# Patient Record
Sex: Female | Born: 1980 | Race: White | Hispanic: No | Marital: Married | Smoking: Former smoker
Health system: Southern US, Community
[De-identification: ages and names within clinical notes are randomized; demographics above are authoritative.]

## PROBLEM LIST (undated history)

## (undated) DIAGNOSIS — D649 Anemia, unspecified: Secondary | ICD-10-CM

## (undated) DIAGNOSIS — M6530 Trigger finger, unspecified finger: Secondary | ICD-10-CM

## (undated) DIAGNOSIS — E785 Hyperlipidemia, unspecified: Secondary | ICD-10-CM

## (undated) DIAGNOSIS — G43909 Migraine, unspecified, not intractable, without status migrainosus: Secondary | ICD-10-CM

## (undated) HISTORY — DX: Hyperlipidemia, unspecified: E78.5

## (undated) HISTORY — PX: WISDOM TOOTH EXTRACTION: SHX21

## (undated) HISTORY — DX: Migraine, unspecified, not intractable, without status migrainosus: G43.909

---
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
## 1998-06-20 ENCOUNTER — Other Ambulatory Visit: Admission: RE | Admit: 1998-06-20 | Discharge: 1998-06-20 | Payer: Self-pay | Admitting: Family Medicine

## 1998-08-13 ENCOUNTER — Ambulatory Visit (HOSPITAL_COMMUNITY): Admission: RE | Admit: 1998-08-13 | Discharge: 1998-08-13 | Payer: Self-pay

## 1998-08-13 ENCOUNTER — Encounter: Payer: Self-pay | Admitting: Family Medicine

## 1999-01-09 ENCOUNTER — Encounter (HOSPITAL_COMMUNITY): Admission: RE | Admit: 1999-01-09 | Discharge: 1999-01-14 | Payer: Self-pay | Admitting: Family Medicine

## 1999-01-13 ENCOUNTER — Inpatient Hospital Stay (HOSPITAL_COMMUNITY): Admission: AD | Admit: 1999-01-13 | Discharge: 1999-01-19 | Payer: Self-pay | Admitting: Family Medicine

## 1999-08-11 ENCOUNTER — Other Ambulatory Visit: Admission: RE | Admit: 1999-08-11 | Discharge: 1999-08-11 | Payer: Self-pay | Admitting: Family Medicine

## 2000-08-11 ENCOUNTER — Other Ambulatory Visit: Admission: RE | Admit: 2000-08-11 | Discharge: 2000-08-11 | Payer: Self-pay | Admitting: Family Medicine

## 2001-10-10 ENCOUNTER — Other Ambulatory Visit: Admission: RE | Admit: 2001-10-10 | Discharge: 2001-10-10 | Payer: Self-pay | Admitting: Family Medicine

## 2003-08-16 ENCOUNTER — Other Ambulatory Visit: Admission: RE | Admit: 2003-08-16 | Discharge: 2003-08-16 | Payer: Self-pay | Admitting: Family Medicine

## 2004-08-31 ENCOUNTER — Other Ambulatory Visit: Admission: RE | Admit: 2004-08-31 | Discharge: 2004-08-31 | Payer: Self-pay | Admitting: Obstetrics and Gynecology

## 2004-12-21 ENCOUNTER — Inpatient Hospital Stay (HOSPITAL_COMMUNITY): Admission: AD | Admit: 2004-12-21 | Discharge: 2004-12-21 | Payer: Self-pay | Admitting: Obstetrics and Gynecology

## 2005-01-14 ENCOUNTER — Ambulatory Visit (HOSPITAL_COMMUNITY): Admission: RE | Admit: 2005-01-14 | Discharge: 2005-01-14 | Payer: Self-pay | Admitting: Obstetrics and Gynecology

## 2005-03-02 ENCOUNTER — Inpatient Hospital Stay (HOSPITAL_COMMUNITY): Admission: RE | Admit: 2005-03-02 | Discharge: 2005-03-04 | Payer: Self-pay | Admitting: Obstetrics and Gynecology

## 2006-06-28 IMAGING — US US ABDOMEN COMPLETE
1 series · 18 of 25 positions shown · non-contrast
Comparison: none

CLINICAL DATA: 31-weeks estimated gestational age with right upper quadrant pain.  Evaluate gallbladder.  
ABDOMEN ULTRASOUND:
TECHNIQUE: Complete abdominal ultrasound examination was performed including evaluation of the liver, gallbladder, bile ducts, pancreas, kidneys, spleen, IVC, and abdominal aorta.

[Series 1: us abdomen complete · 18 of 81 slices shown]
[im 1/81]
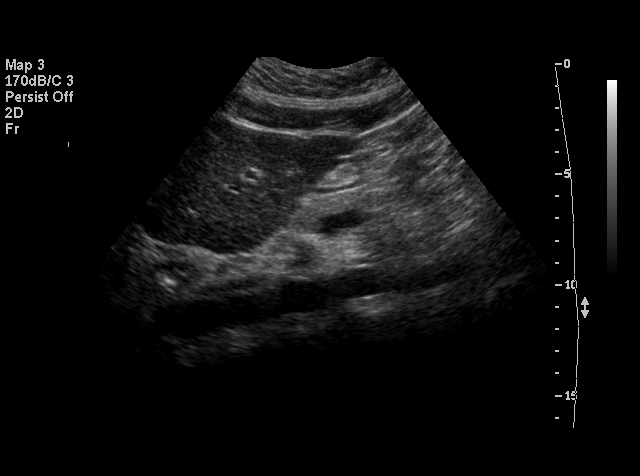
[im 7/81]
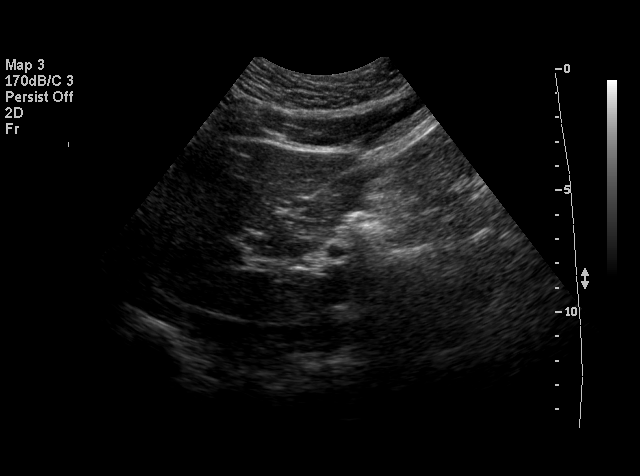
[im 11/81]
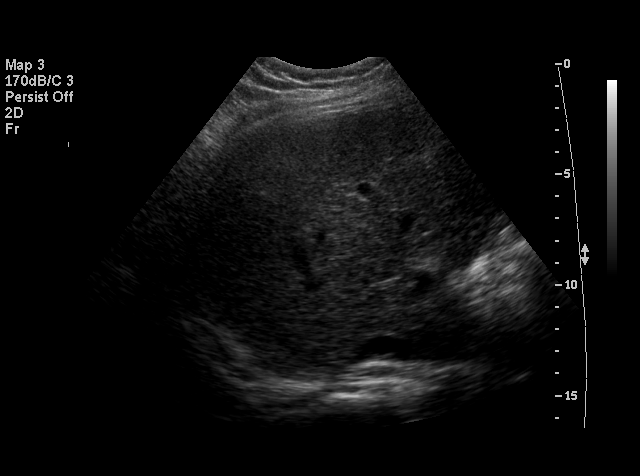
[im 14/81]
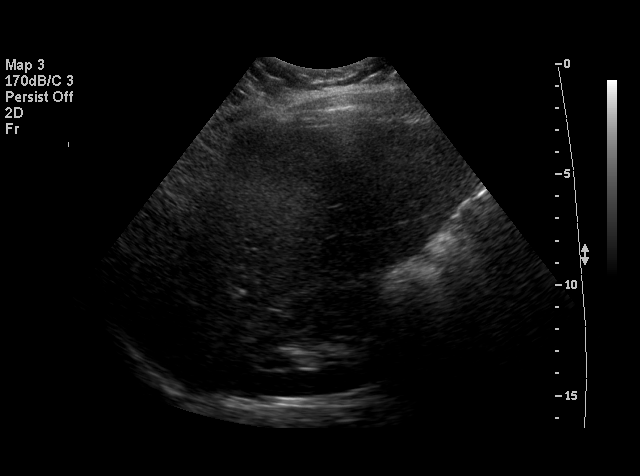
[im 21/81]
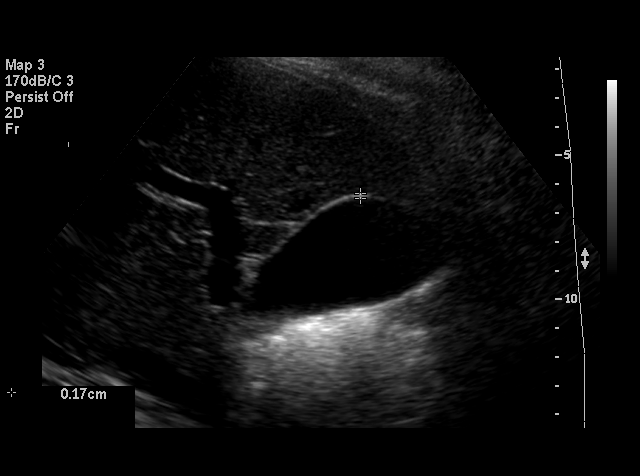
[im 24/81]
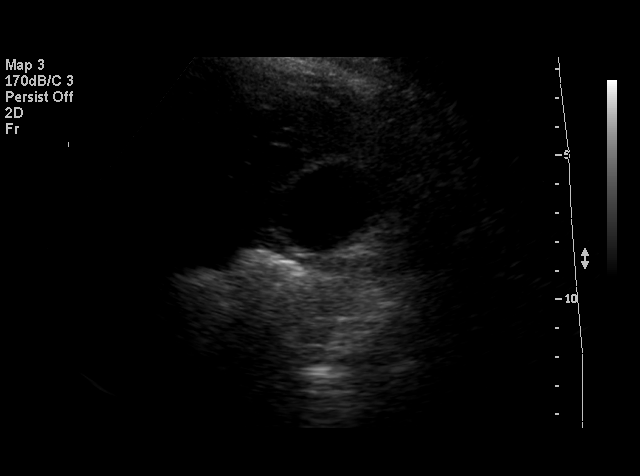
[im 31/81]
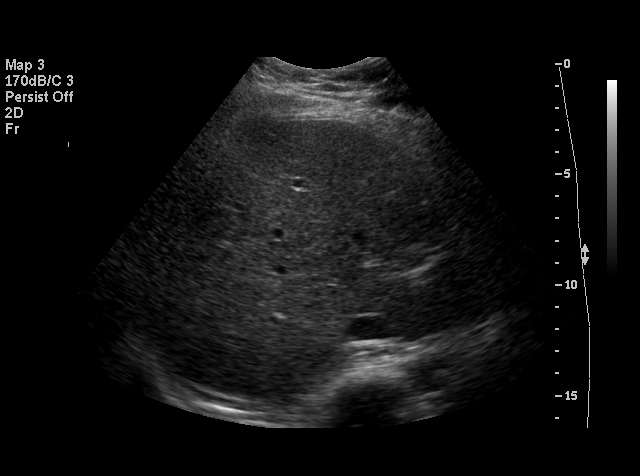
[im 34/81]
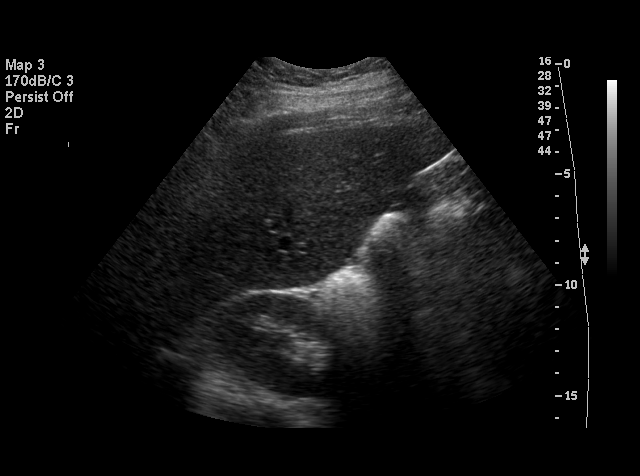
[im 37/81]
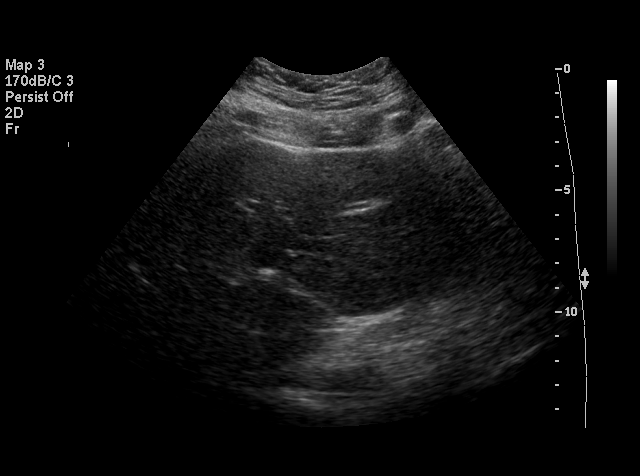
[im 44/81]
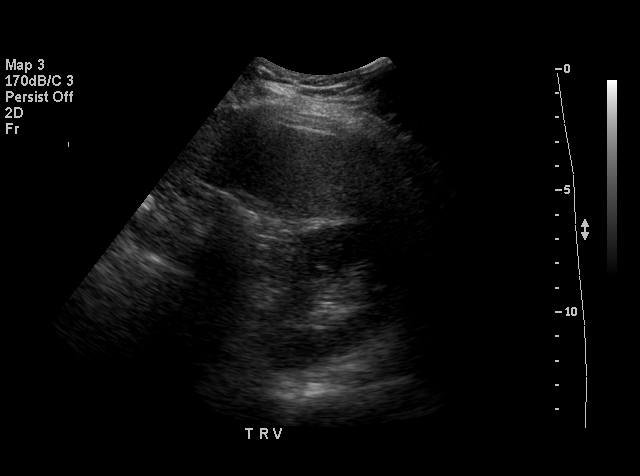
[im 47/81]
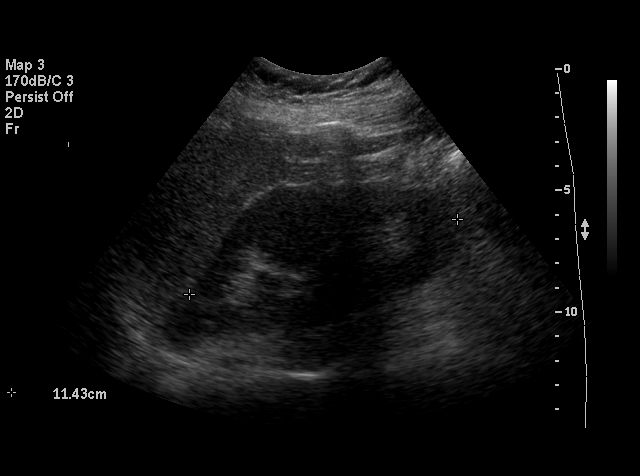
[im 51/81]
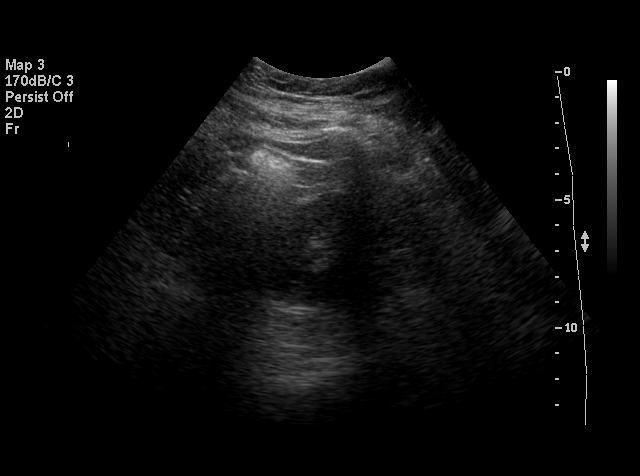
[im 57/81]
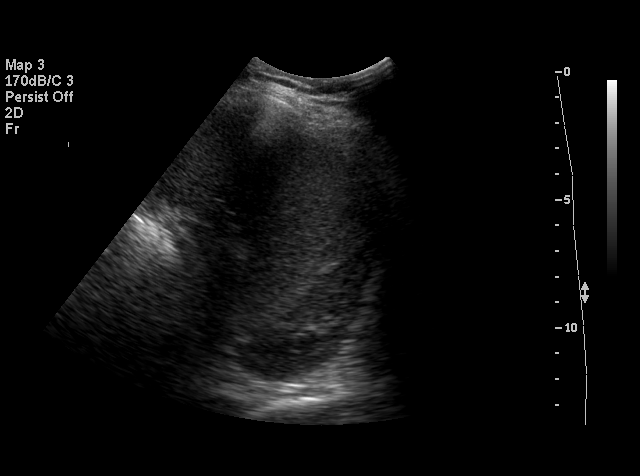
[im 61/81]
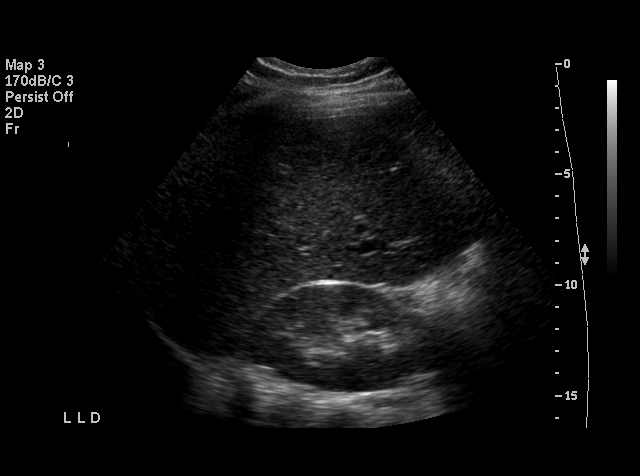
[im 67/81]
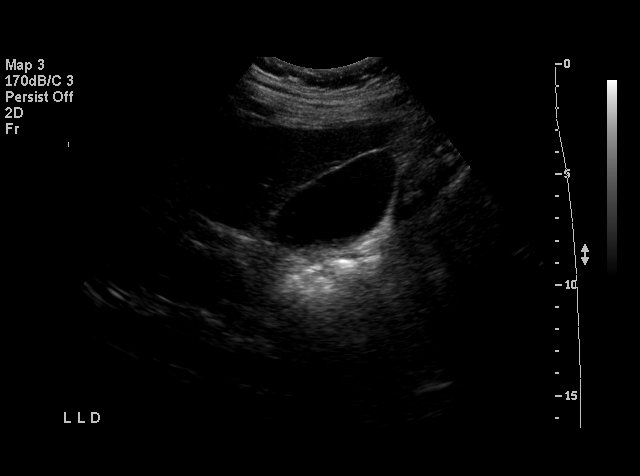
[im 71/81]
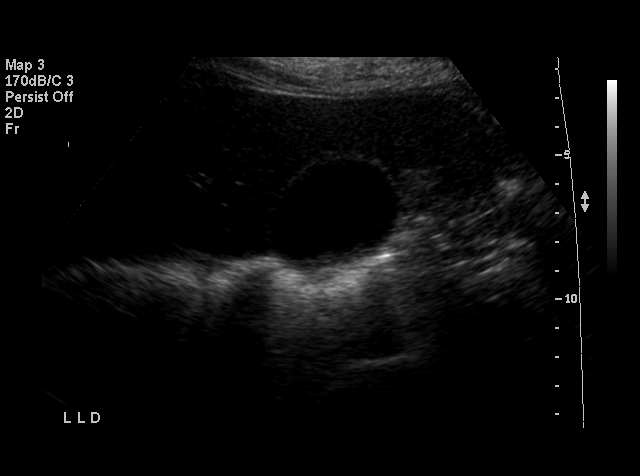
[im 74/81]
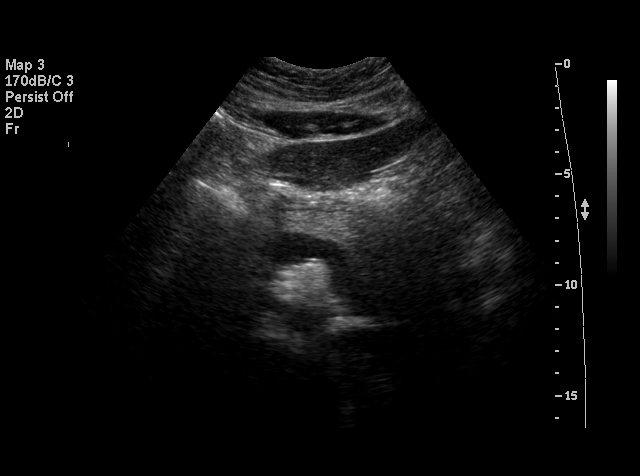
[im 81/81]
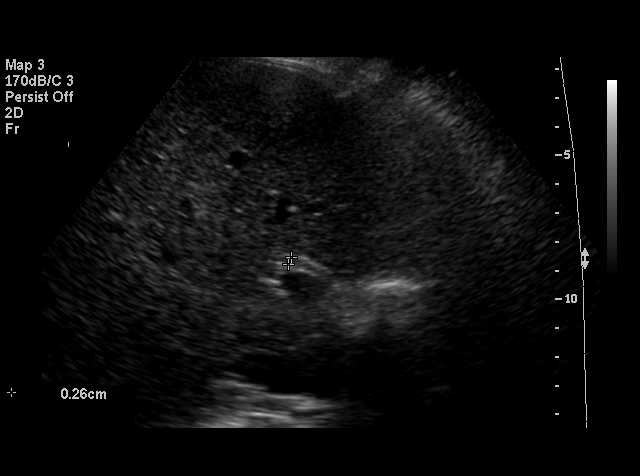

[18 of 25 positions shown; findings below may reference images not displayed]

FINDINGS: The liver parenchyma appears homogeneous in echotexture without evidence of focal parenchymal abnormality or intrahepatic ductal dilatation.  The gallbladder is well distended and shows no evidence of intraluminal stones or sludge.  No pericholecystic fluid or gallbladder wall thickening is seen.  The common bile duct measures .22 cm in AP width and this is within normal limits for size.  
The pancreas is seen in its entirety and is normal in size and echogenicity.  Both kidneys have a normal appearance with the left kidney having a sagittal length of 11.5 cm and the right kidney having a sagittal length of 11.9 cm.  No focal parenchymal abnormalities or signs of hydronephrosis are noted.   The spleen has a normal ultrasound appearance.  No intraabdominal fluid is seen.  
The proximal portions of the inferior vena cava and abdominal aorta are within normal limits.  The distal aspect of the aorta was obscured by the patient?s gravid uterus.
IMPRESSION: Normal abdominal ultrasound.

## 2015-12-22 ENCOUNTER — Encounter: Payer: Self-pay | Admitting: Physician Assistant

## 2015-12-22 ENCOUNTER — Ambulatory Visit (INDEPENDENT_AMBULATORY_CARE_PROVIDER_SITE_OTHER): Payer: BLUE CROSS/BLUE SHIELD | Admitting: Physician Assistant

## 2015-12-22 VITALS — BP 116/77 | HR 75 | Temp 97.1°F | Ht 65.0 in | Wt 245.2 lb

## 2015-12-22 DIAGNOSIS — Z0000 Encounter for general adult medical examination without abnormal findings: Secondary | ICD-10-CM

## 2015-12-22 MED ORDER — AMOXICILLIN 250 MG/5ML PO SUSR: 250.0000 mg | Freq: Three times a day (TID) | ORAL | 0 refills | Status: DC

## 2015-12-22 NOTE — Patient Instructions (Addendum)
Lipid Profile Test Why am I having this test? The lipid profile test gives results that can help predict the likelihood of developing heart disease. The test is also used to monitor treatment for high cholesterol to see if you are reaching your goals. A lipid profile measures the following:  Total cholesterol. Cholesterol is a waxy fat in your blood. If your total cholesterol is elevated, this can increase your risk of coronary heart disease.  High-density lipoprotein (HDL). This is known as the good cholesterol. Having a high level of HDL is good. Your HDL level may be low if you smoke or do not get enough exercise.  Low-density lipoprotein (LDL). This is known as the bad cholesterol and is responsible for the formation of plaque in the arteries. Having a low level of LDL is best.  Cholesterol to HDL ratio. This is calculated by dividing the total cholesterol by the HDL cholesterol. The ratio is used by health care providers for determining your risk of heart disease. A low ratio is best.  Triglycerides. These are a type of fat in the blood responsible for providing energy to your cells. Low levels are best. What kind of sample is taken? A blood sample is required for this test. It is usually collected by inserting a needle into a vein. How do I prepare for this test? Do not eat or drink anything after midnight on the night before the test or as directed by your health care provider. What are the reference ranges? Reference ranges are considered healthy ranges established after testing a large group of healthy people. Reference ranges may vary among different people, labs, and hospitals. It is your responsibility to obtain your test results. Ask the lab or department performing the test when and how you will get your results. Reference ranges for the lipid profile test are as follows: Total Cholesterol  Adult or elderly: less than 200 mg/dL or less than 7.825.20 mmol/L (SI units).  Child:  120-200 mg/dL.  Infant: 70-175 mg/dL.  Newborns: 53-135 mg/dL. HDL  Female: greater than 45 mg/dL or greater than 9.560.75 mmol/L (SI units).  Female: greater than 55 mg/dL or greater than 2.130.91 mmol/L (SI units). HDL reference values based on risk of heart disease:  For low risk of heart disease:  Female: 60 mg/dL or 0.861.55 mmol/L.  Female: 70 mg/dL or 5.781.81 mmol/L.  For moderate risk of heart disease:  Female: 45 mg/dL or 4.691.17 mmol/L.  Female: 55 mg/dL or 6.291.42 mmol/L.  For high risk of heart disease:  Female: 25 mg/dL or 5.280.65 mmol/L.  Female: 35 mg/dL or 4.130.90 mmol/L. LDL  Adult: less than 130 mg/dL.  Children: less than 110 mg/dL. Cholesterol to HDL Ratio  Reference values based on risk for coronary heart disease:  Risk that is one half average:  Female: 3.4.  Female: 3.3.  Average risk:  Female: 5.0.  Female: 4.4.  Risk that is two times average (moderate risk):  Female: 10.0.  Female: 7.0.  Risk that is three times average (high risk):    Female: 11.0. Triglycerides  Adult or elderly:  Female: 40-160 mg/dL or 2.44-0.100.45-1.81 mmol/L (SI units).  Female: 35-135 mg/dL or 2.72-5.360.40-1.52 mmol/L (SI units).  Children 410-91 years old:  Female: 30-86 mg/dL.  Female: 32-99 mg/dL.  Children 376-677 years old:  Female: 31-108 mg/dL.  Female: 35-114 mg/dL.  Children 2012-37 years old:  Female: 36-138 mg/dL.  Female: 41-138 mg/dL.  Children 6516-37 years old:  Female: 40-163 mg/dL.  Female: 40-128 mg/dL.  Triglycerides should be less than 400 mg/dL even when you are not fasting. What do the results mean? Talk with your health care provider to discuss your results, treatment options, and if necessary, the need for more tests. Talk with your health care provider if you have any questions about your results. Talk with your health care provider to discuss your results, treatment options, and if necessary, the need for more tests. Talk with your health care provider if you have any  questions about your results. This information is not intended to replace advice given to you by your health care provider. Make sure you discuss any questions you have with your health care provider. Document Released: 01/22/2004 Document Revised: 09/03/2015 Document Reviewed: 04/19/2013 Elsevier Interactive Patient Education  2017 [employer]

## 2015-12-23 NOTE — Progress Notes (Signed)
BP 116/77   Pulse 75   Temp 97.1 F (36.2 C) (Oral)   Ht 5\' 5"  (1.651 m)   Wt 245 lb 3.2 oz (111.2 kg)   LMP 12/22/2015   BMI 40.80 kg/m    Subjective:    Patient ID: Chelsea Snyder, female    DOB: February 08, 1980, 35 y.o.   MRN: [id number]  Chelsea Snyder is a 35 y.o. female presenting on 12/22/2015 for Annual Exam  HPI Patient here to be [facility].  This patient [facility] to [facility]. This patient comes in for annual well exam. All medications are reviewed today. There are no reports of any problems with the medications. All of the medical conditions are reviewed and updated.  Lab work is reviewed and will be ordered as medically necessary. There are no new problems reported with today's visit.   Past Medical History:  Diagnosis Date  . Hyperlipidemia   . Migraine    Relevant past medical, surgical, family and social history reviewed and updated as indicated. Interim medical history since our last visit reviewed. Allergies and medications reviewed and updated.   Data reviewed from any sources in EPIC.  Review of Systems  Constitutional: Negative.  Negative for activity change, fatigue and fever.  HENT: Negative.   Eyes: Negative.   Respiratory: Negative.  Negative for cough.   Cardiovascular: Negative.  Negative for chest pain.  Gastrointestinal: Negative.  Negative for abdominal pain.  Endocrine: Negative.   Genitourinary: Negative.  Negative for dysuria.  Musculoskeletal: Negative.   Skin: Negative.   Neurological: Negative.      Social History   Social History  . Marital status: Married    Spouse name: N/A  . Number of children: N/A  . Years of education: N/A   Occupational History  . Not on file.   Social History Main Topics  . Smoking status: Never Smoker  . Smokeless tobacco: Never Used  . Alcohol use No  . Drug use: No  . Sexual activity: Not on file   Other Topics  Concern  . Not on file   Social History Narrative  . No narrative on file    Past Surgical History:  Procedure Laterality Date  . CESAREAN SECTION      Family History  Problem Relation Age of Onset  . Cancer Mother     lung  . Stroke Father       Medication List       Accurate as of 12/22/15 11:59 PM. Always use your most recent med list.          SUMAtriptan 100 MG tablet Commonly known as:  IMITREX Take 100 mg by mouth every 2 (two) hours as needed for migraine. May repeat in 2 hours if headache persists or recurs.          Objective:    BP 116/77   Pulse 75   Temp 97.1 F (36.2 C) (Oral)   Ht 5\' 5"  (1.651 m)   Wt 245 lb 3.2 oz (111.2 kg)   LMP 12/22/2015   BMI 40.80 kg/m   Allergies  Allergen Reactions  . Albuterol Itching  . Keflex [Cephalexin] Rash   Wt Readings from Last 3 Encounters:  12/22/15 245 lb 3.2 oz (111.2 kg)    Physical Exam  Constitutional: She is oriented to person, place, and time. She appears well-developed and well-nourished.  HENT:  Head: Normocephalic and atraumatic.  Right Ear: Tympanic membrane, external ear and ear canal normal.  Left Ear: Tympanic membrane, external ear and ear canal normal.  Nose: Nose normal. No rhinorrhea.  Mouth/Throat: Oropharynx is clear and moist and mucous membranes are normal. No oropharyngeal exudate or posterior oropharyngeal erythema.  Eyes: Conjunctivae and EOM are normal. Pupils are equal, round, and reactive to light.  Neck: Normal range of motion. Neck supple.  Cardiovascular: Normal rate, regular rhythm, normal heart sounds and intact distal pulses.   Pulmonary/Chest: Effort normal and breath sounds normal.  Abdominal: Soft. Bowel sounds are normal.  Neurological: She is alert and oriented to person, place, and time. She has normal reflexes.  Skin: Skin is warm and dry. No rash noted.  Psychiatric: She has a normal mood and affect. Her behavior is normal. Judgment and thought content  normal.  Nursing note and vitals reviewed.      Assessment & Plan:   1. Well adult exam   Continue all other maintenance medications as listed above. Educational handout given for lab test  Follow up plan: Return in about 1 year (around 12/21/2016).  Remus LofflerAngel S. Mardella Nuckles PA-C [facility] [address]  [place], [place] [address] [phone]   12/23/2015, 9:26 AM

## 2017-04-06 ENCOUNTER — Encounter: Payer: Self-pay | Admitting: Physician Assistant

## 2017-04-06 ENCOUNTER — Ambulatory Visit (INDEPENDENT_AMBULATORY_CARE_PROVIDER_SITE_OTHER): Payer: BLUE CROSS/BLUE SHIELD | Admitting: Physician Assistant

## 2017-04-06 VITALS — BP 120/77 | HR 77 | Ht 65.0 in | Wt 253.0 lb

## 2017-04-06 DIAGNOSIS — Z0000 Encounter for general adult medical examination without abnormal findings: Secondary | ICD-10-CM | POA: Diagnosis not present

## 2017-04-06 DIAGNOSIS — L299 Pruritus, unspecified: Secondary | ICD-10-CM

## 2017-04-06 MED ORDER — LORATADINE 10 MG PO TABS: 10.0000 mg | ORAL_TABLET | Freq: Every day | ORAL | 3 refills | Status: DC

## 2017-04-06 NOTE — Patient Instructions (Addendum)
In a few days you may receive a survey in the mail or online from [employer] regarding your visit with us today. Please take a moment to fill this out. Your feedback is very important to our whole office. It can help us better understand your needs as well as improve your experience and satisfaction. Thank you for taking your time to complete it. We care about you.  Prudy FeelerAngel Aminta Sakurai, PA-C   physician for Women  Dr. Rana SnareLowe, Dr. Langston MaskerMorris, and Nedra HaiAtkins  Kathy Richardson, MD   Obstetrician-gynecologist in [place], [facility]  Address: [address], [place], [place] [address]   Phone: [phone]

## 2017-04-06 NOTE — Progress Notes (Signed)
BP 120/77   Pulse 77   Ht '5\' 5"'  (1.651 m)   Wt 253 lb (114.8 kg)   BMI 42.10 kg/m    Subjective:    Patient ID: Chelsea Snyder, female    DOB: [birth year], 37 y.o.   MRN: [id number]  HPI: Chelsea Snyder is a 37 y.o. female presenting on 04/06/2017 for Annual Exam  This patient comes in for annual well physical examination. All medications are reviewed today. There are no reports of any problems with the medications. All of the medical conditions are reviewed and updated.  Lab work is reviewed and will be ordered as medically necessary. There are no new problems reported with today's visit.  Patient reports doing well overall.  Allergies are going this spring, mainly itching. No other issues.  Past Medical History:  Diagnosis Date  . Hyperlipidemia   . Migraine    Relevant past medical, surgical, family and social history reviewed and updated as indicated. Interim medical history since our last visit reviewed. Allergies and medications reviewed and updated. DATA REVIEWED: CHART IN EPIC  Family History reviewed for pertinent findings.  Review of Systems  Constitutional: Negative.  Negative for activity change, fatigue and fever.  HENT: Negative.   Eyes: Negative.   Respiratory: Negative.  Negative for cough.   Cardiovascular: Negative.  Negative for chest pain.  Gastrointestinal: Negative.  Negative for abdominal pain.  Endocrine: Negative.   Genitourinary: Negative.  Negative for dysuria.  Musculoskeletal: Negative.   Skin: Negative.   Neurological: Negative.     Allergies as of 04/06/2017      Reactions   Albuterol Itching   Keflex [cephalexin] Rash      Medication List        Accurate as of 04/06/17  1:55 PM. Always use your most recent med list.          loratadine 10 MG tablet Commonly known as:  CLARITIN Take 1 tablet (10 mg total) by mouth daily.   SUMAtriptan 100 MG tablet Commonly known as:  IMITREX Take 100 mg by mouth every 2 (two) hours as  needed for migraine. May repeat in 2 hours if headache persists or recurs.          Objective:    BP 120/77   Pulse 77   Ht '5\' 5"'  (1.651 m)   Wt 253 lb (114.8 kg)   BMI 42.10 kg/m   Allergies  Allergen Reactions  . Albuterol Itching  . Keflex [Cephalexin] Rash    Wt Readings from Last 3 Encounters:  04/06/17 253 lb (114.8 kg)  12/22/15 245 lb 3.2 oz (111.2 kg)    Physical Exam  Constitutional: She is oriented to person, place, and time. She appears well-developed and well-nourished.  HENT:  Head: Normocephalic and atraumatic.  Right Ear: Tympanic membrane, external ear and ear canal normal.  Left Ear: Tympanic membrane, external ear and ear canal normal.  Nose: Nose normal. No rhinorrhea.  Mouth/Throat: Oropharynx is clear and moist and mucous membranes are normal. No oropharyngeal exudate or posterior oropharyngeal erythema.  Eyes: Pupils are equal, round, and reactive to light. Conjunctivae and EOM are normal.  Neck: Normal range of motion. Neck supple.  Cardiovascular: Normal rate, regular rhythm, normal heart sounds and intact distal pulses.  Pulmonary/Chest: Effort normal and breath sounds normal.  Abdominal: Soft. Bowel sounds are normal.  Neurological: She is alert and oriented to person, place, and time. She has normal reflexes.  Skin: Skin is warm and dry.  No rash noted.  Psychiatric: She has a normal mood and affect. Her behavior is normal. Judgment and thought content normal.    No results found for this or any previous visit.    Assessment & Plan:   1. Well adult exam - CBC with Differential/Platelet - CMP14+EGFR - Lipid panel  2. Itching - loratadine (CLARITIN) 10 MG tablet; Take 1 tablet (10 mg total) by mouth daily.  Dispense: 90 tablet; Refill: 3   Continue all other maintenance medications as listed above.  Follow up plan: Return in about 1 year (around 04/07/2018) for well.  Educational handout given for survey GYN recommendations  Terald Sleeper PA-C [facility] [address]  [place], [place] [address] [phone]   04/06/2017, 1:55 PM

## 2017-04-07 LAB — CBC WITH DIFFERENTIAL/PLATELET
Basophils Absolute: 0 10*3/uL (ref 0.0–0.2)
Basos: 0 %
EOS (ABSOLUTE): 0.2 10*3/uL (ref 0.0–0.4)
Eos: 2 %
Hematocrit: 36.2 % (ref 34.0–46.6)
Hemoglobin: 12.3 g/dL (ref 11.1–15.9)
Immature Grans (Abs): 0 10*3/uL (ref 0.0–0.1)
Immature Granulocytes: 0 %
Lymphocytes Absolute: 2.1 10*3/uL (ref 0.7–3.1)
Lymphs: 28 %
MCH: 27.6 pg (ref 26.6–33.0)
MCHC: 34 g/dL (ref 31.5–35.7)
MCV: 81 fL (ref 79–97)
Monocytes Absolute: 0.4 10*3/uL (ref 0.1–0.9)
Monocytes: 6 %
Neutrophils Absolute: 4.7 10*3/uL (ref 1.4–7.0)
Neutrophils: 64 %
Platelets: 268 10*3/uL (ref 150–379)
RBC: 4.46 x10E6/uL (ref 3.77–5.28)
RDW: 14.5 % (ref 12.3–15.4)
WBC: 7.4 10*3/uL (ref 3.4–10.8)

## 2017-04-07 LAB — CMP14+EGFR
ALT: 10 IU/L (ref 0–32)
AST: 12 IU/L (ref 0–40)
Albumin/Globulin Ratio: 1.8 (ref 1.2–2.2)
Albumin: 4.2 g/dL (ref 3.5–5.5)
Alkaline Phosphatase: 42 IU/L (ref 39–117)
BUN/Creatinine Ratio: 14 (ref 9–23)
BUN: 10 mg/dL (ref 6–20)
Bilirubin Total: 0.3 mg/dL (ref 0.0–1.2)
CO2: 22 mmol/L (ref 20–29)
Calcium: 9.1 mg/dL (ref 8.7–10.2)
Chloride: 106 mmol/L (ref 96–106)
Creatinine, Ser: 0.7 mg/dL (ref 0.57–1.00)
GFR calc Af Amer: 127 mL/min/{1.73_m2} (ref >59)
GFR calc non Af Amer: 110 mL/min/{1.73_m2} (ref >59)
Globulin, Total: 2.3 g/dL (ref 1.5–4.5)
Glucose: 84 mg/dL (ref 65–99)
Potassium: 4.2 mmol/L (ref 3.5–5.2)
Sodium: 144 mmol/L (ref 134–144)
Total Protein: 6.5 g/dL (ref 6.0–8.5)

## 2017-04-07 LAB — LIPID PANEL
Chol/HDL Ratio: 5.1 ratio — ABNORMAL HIGH (ref 0.0–4.4)
Cholesterol, Total: 236 mg/dL — ABNORMAL HIGH (ref 100–199)
HDL: 46 mg/dL
LDL Calculated: 162 mg/dL — ABNORMAL HIGH (ref 0–99)
Triglycerides: 141 mg/dL (ref 0–149)
VLDL Cholesterol Cal: 28 mg/dL (ref 5–40)

## 2018-10-10 ENCOUNTER — Ambulatory Visit (INDEPENDENT_AMBULATORY_CARE_PROVIDER_SITE_OTHER): Payer: Managed Care, Other (non HMO) | Admitting: Family Medicine

## 2018-10-10 ENCOUNTER — Encounter: Payer: Self-pay | Admitting: Family Medicine

## 2018-10-10 ENCOUNTER — Other Ambulatory Visit: Payer: Self-pay

## 2018-10-10 VITALS — BP 112/72 | HR 68 | Temp 97.7°F | Resp 20 | Ht 65.0 in | Wt 209.0 lb

## 2018-10-10 DIAGNOSIS — Z0000 Encounter for general adult medical examination without abnormal findings: Secondary | ICD-10-CM

## 2018-10-10 DIAGNOSIS — Z6834 Body mass index (BMI) 34.0-34.9, adult: Secondary | ICD-10-CM | POA: Insufficient documentation

## 2018-10-10 DIAGNOSIS — J301 Allergic rhinitis due to pollen: Secondary | ICD-10-CM | POA: Diagnosis not present

## 2018-10-10 DIAGNOSIS — G43119 Migraine with aura, intractable, without status migrainosus: Secondary | ICD-10-CM | POA: Insufficient documentation

## 2018-10-10 DIAGNOSIS — J302 Other seasonal allergic rhinitis: Secondary | ICD-10-CM | POA: Insufficient documentation

## 2018-10-10 DIAGNOSIS — N926 Irregular menstruation, unspecified: Secondary | ICD-10-CM | POA: Insufficient documentation

## 2018-10-10 DIAGNOSIS — Z23 Encounter for immunization: Secondary | ICD-10-CM

## 2018-10-10 DIAGNOSIS — Z0001 Encounter for general adult medical examination with abnormal findings: Secondary | ICD-10-CM | POA: Diagnosis not present

## 2018-10-10 NOTE — Progress Notes (Addendum)
Subjective:  Patient ID: Chelsea Snyder, female    DOB: [birth year], 38 y.o.   MRN: [id number]  Patient Care Team: Theodoro Clock as PCP - General (Physician Assistant)   Chief Complaint:  Annual Exam   HPI: Chelsea Snyder is a 38 y.o. female presenting on 10/10/2018 for Annual Exam   Pt presents today for her annual physical exam. States she is overall healthy. States she does have migraine headaches. States she may have 2-4 headache days per month that she controls with Tylenol or Imitrex as needed.  She reports over the last several months she has had irregular menses. States her LMP was 2 weeks ago but it was very heavy and she had significant cramping with her cycle. She states at times her cycle is very heavy and at times light. Pt is due for her PAP. States she has not had a PAP in several years. She does not wish to complete this today, states she will follow up with GYN. She denies previous abnormal PAPs.  She does not watch her diet or exercise on a regular basis.  Migraine  This is a chronic problem. The current episode started more than 1 year ago. The problem occurs intermittently. The problem has been waxing and waning. The quality of the pain is described as throbbing and aching. The pain is at a severity of 3/10. The pain is mild. Associated symptoms include nausea, phonophobia and photophobia. Pertinent negatives include no abnormal behavior, anorexia, back pain, blurred vision, coughing, dizziness, drainage, ear pain, eye pain, eye redness, eye watering, facial sweating, fever, hearing loss, insomnia, loss of balance, muscle aches, neck pain, numbness, rhinorrhea, scalp tenderness, seizures, sinus pressure, sore throat, swollen glands, tingling, tinnitus, visual change, vomiting, weakness or weight loss. The symptoms are aggravated by bright light, noise and menstrual cycle. She has tried triptans and acetaminophen for the symptoms. The treatment provided significant  relief. Her past medical history is significant for migraine headaches.    Relevant past medical, surgical, family, and social history reviewed and updated as indicated.  Allergies and medications reviewed and updated. Date reviewed: Chart in Epic.   Past Medical History:  Diagnosis Date   Hyperlipidemia    Migraine     Past Surgical History:  Procedure Laterality Date   CESAREAN SECTION      Social History   Socioeconomic History   Marital status: Married    Spouse name: Not on file   Number of children: Not on file   Years of education: Not on file   Highest education level: Not on file  Occupational History   Not on file  Social Needs   Financial resource strain: Not on file   Food insecurity    Worry: Not on file    Inability: Not on file   Transportation needs    Medical: Not on file    Non-medical: Not on file  Tobacco Use   Smoking status: Never Smoker   Smokeless tobacco: Never Used  Substance and Sexual Activity   Alcohol use: No   Drug use: No   Sexual activity: Not on file  Lifestyle   Physical activity    Days per week: Not on file    Minutes per session: Not on file   Stress: Not on file  Relationships   Social connections    Talks on phone: Not on file    Gets together: Not on file    Attends religious service:  Not on file    Active member of club or organization: Not on file    Attends meetings of clubs or organizations: Not on file    Relationship status: Not on file   Intimate partner violence    Fear of current or ex partner: Not on file    Emotionally abused: Not on file    Physically abused: Not on file    Forced sexual activity: Not on file  Other Topics Concern   Not on file  Social History Narrative   Not on file    Outpatient Encounter Medications as of 10/10/2018  Medication Sig   loratadine (CLARITIN) 10 MG tablet Take 1 tablet (10 mg total) by mouth daily. (Patient not taking: Reported on 10/10/2018)     SUMAtriptan (IMITREX) 100 MG tablet Take 100 mg by mouth every 2 (two) hours as needed for migraine. May repeat in 2 hours if headache persists or recurs.   No facility-administered encounter medications on file as of 10/10/2018.     Allergies  Allergen Reactions   Albuterol Itching   Keflex [Cephalexin] Rash    Review of Systems  Constitutional: Negative for activity change, appetite change, chills, diaphoresis, fatigue, fever, unexpected weight change and weight loss.  HENT: Negative.  Negative for ear pain, hearing loss, rhinorrhea, sinus pressure, sore throat and tinnitus.   Eyes: Positive for photophobia. Negative for blurred vision, pain, redness and visual disturbance.  Respiratory: Negative for cough, chest tightness and shortness of breath.   Cardiovascular: Negative for chest pain, palpitations and leg swelling.  Gastrointestinal: Positive for nausea. Negative for abdominal distention, anal bleeding, anorexia, blood in stool, constipation, diarrhea, rectal pain and vomiting.  Endocrine: Negative.  Negative for cold intolerance, heat intolerance, polydipsia, polyphagia and polyuria.  Genitourinary: Positive for menstrual problem. Negative for decreased urine volume, difficulty urinating, dyspareunia, flank pain, frequency, genital sores, hematuria, pelvic pain, urgency, vaginal bleeding, vaginal discharge and vaginal pain.  Musculoskeletal: Negative for arthralgias, back pain, myalgias and neck pain.  Skin: Negative.  Negative for color change, pallor and rash.  Allergic/Immunologic: Negative.   Neurological: Positive for headaches. Negative for dizziness, tingling, tremors, seizures, syncope, facial asymmetry, speech difficulty, weakness, light-headedness, numbness and loss of balance.  Hematological: Negative.   Psychiatric/Behavioral: Negative for confusion, hallucinations, sleep disturbance and suicidal ideas. The patient does not have insomnia.   All other systems  reviewed and are negative.       Objective:  BP 112/72    Pulse 68    Temp 97.7 F (36.5 C)    Resp 20    Ht '5\' 5"'  (1.651 m)    Wt 209 lb (94.8 kg)    LMP 09/26/2018    SpO2 100%    BMI 34.78 kg/m    Wt Readings from Last 3 Encounters:  10/10/18 209 lb (94.8 kg)  04/06/17 253 lb (114.8 kg)  12/22/15 245 lb 3.2 oz (111.2 kg)    Physical Exam Vitals signs and nursing note reviewed.  Constitutional:      General: She is not in acute distress.    Appearance: Normal appearance. She is well-developed and well-groomed. She is obese. She is not ill-appearing, toxic-appearing or diaphoretic.  HENT:     Head: Normocephalic and atraumatic.     Jaw: There is normal jaw occlusion.     Right Ear: Hearing, tympanic membrane, ear canal and external ear normal.     Left Ear: Hearing, tympanic membrane, ear canal and external ear normal.  Nose: Nose normal.     Mouth/Throat:     Lips: Pink.     Mouth: Mucous membranes are moist.     Pharynx: Oropharynx is clear. Uvula midline.  Eyes:     General: Lids are normal.     Extraocular Movements: Extraocular movements intact.     Conjunctiva/sclera: Conjunctivae normal.     Pupils: Pupils are equal, round, and reactive to light.  Neck:     Musculoskeletal: Normal range of motion and neck supple.     Thyroid: No thyroid mass, thyromegaly or thyroid tenderness.     Vascular: No carotid bruit or JVD.     Trachea: Trachea and phonation normal.   Cardiovascular:     Rate and Rhythm: Normal rate and regular rhythm.     Chest Wall: PMI is not displaced.     Pulses: Normal pulses.     Heart sounds: Normal heart sounds. No murmur. No friction rub. No gallop.   Pulmonary:     Effort: Pulmonary effort is normal. No respiratory distress.     Breath sounds: Normal breath sounds. No wheezing.  Abdominal:     General: Abdomen is protuberant. Bowel sounds are normal. There is no distension or abdominal bruit.     Palpations: Abdomen is soft. There is  no hepatomegaly, splenomegaly or mass.     Tenderness: There is no abdominal tenderness. There is no right CVA tenderness, left CVA tenderness, guarding or rebound.     Hernia: No hernia is present.  Genitourinary:    Comments: Pt declined GYN exam and PAP today.  Musculoskeletal: Normal range of motion.     Right lower leg: No edema.     Left lower leg: No edema.  Lymphadenopathy:     Cervical: Cervical adenopathy present.     Left cervical: Superficial cervical adenopathy (anterior) present.  Skin:    General: Skin is warm and dry.     Capillary Refill: Capillary refill takes less than 2 seconds.     Coloration: Skin is not cyanotic, jaundiced or pale.     Findings: No rash.  Neurological:     General: No focal deficit present.     Mental Status: She is alert and oriented to person, place, and time.     Cranial Nerves: Cranial nerves are intact. No cranial nerve deficit.     Sensory: Sensation is intact. No sensory deficit.     Motor: Motor function is intact. No weakness.     Coordination: Coordination is intact. Coordination normal.     Gait: Gait is intact. Gait normal.     Deep Tendon Reflexes: Reflexes are normal and symmetric. Reflexes normal.  Psychiatric:        Attention and Perception: Attention and perception normal.        Mood and Affect: Mood and affect normal.        Speech: Speech normal.        Behavior: Behavior normal. Behavior is cooperative.        Thought Content: Thought content normal.        Cognition and Memory: Cognition and memory normal.        Judgment: Judgment normal.     Results for orders placed or performed in visit on 04/06/17  CBC with Differential/Platelet  Result Value Ref Range   WBC 7.4 3.4 - 10.8 x10E3/uL   RBC 4.46 3.77 - 5.28 x10E6/uL   Hemoglobin 12.3 11.1 - 15.9 g/dL   Hematocrit 36.2 34.0 - 46.6 %  MCV 81 79 - 97 fL   MCH 27.6 26.6 - 33.0 pg   MCHC 34.0 31.5 - 35.7 g/dL   RDW 14.5 12.3 - 15.4 %   Platelets 268 150 - 379  x10E3/uL   Neutrophils 64 Not Estab. %   Lymphs 28 Not Estab. %   Monocytes 6 Not Estab. %   Eos 2 Not Estab. %   Basos 0 Not Estab. %   Neutrophils Absolute 4.7 1.4 - 7.0 x10E3/uL   Lymphocytes Absolute 2.1 0.7 - 3.1 x10E3/uL   Monocytes Absolute 0.4 0.1 - 0.9 x10E3/uL   EOS (ABSOLUTE) 0.2 0.0 - 0.4 x10E3/uL   Basophils Absolute 0.0 0.0 - 0.2 x10E3/uL   Immature Granulocytes 0 Not Estab. %   Immature Grans (Abs) 0.0 0.0 - 0.1 x10E3/uL  CMP14+EGFR  Result Value Ref Range   Glucose 84 65 - 99 mg/dL   BUN 10 6 - 20 mg/dL   Creatinine, Ser 0.70 0.57 - 1.00 mg/dL   GFR calc non Af Amer 110 >59 mL/min/1.73   GFR calc Af Amer 127 >59 mL/min/1.73   BUN/Creatinine Ratio 14 9 - 23   Sodium 144 134 - 144 mmol/L   Potassium 4.2 3.5 - 5.2 mmol/L   Chloride 106 96 - 106 mmol/L   CO2 22 20 - 29 mmol/L   Calcium 9.1 8.7 - 10.2 mg/dL   Total Protein 6.5 6.0 - 8.5 g/dL   Albumin 4.2 3.5 - 5.5 g/dL   Globulin, Total 2.3 1.5 - 4.5 g/dL   Albumin/Globulin Ratio 1.8 1.2 - 2.2   Bilirubin Total 0.3 0.0 - 1.2 mg/dL   Alkaline Phosphatase 42 39 - 117 IU/L   AST 12 0 - 40 IU/L   ALT 10 0 - 32 IU/L  Lipid panel  Result Value Ref Range   Cholesterol, Total 236 (H) 100 - 199 mg/dL   Triglycerides 141 0 - 149 mg/dL   HDL 46 >39 mg/dL   VLDL Cholesterol Cal 28 5 - 40 mg/dL   LDL Calculated 162 (H) 0 - 99 mg/dL   Chol/HDL Ratio 5.1 (H) 0.0 - 4.4 ratio       Pertinent labs & imaging results that were available during my care of the patient were reviewed by me and considered in my medical decision making.  Assessment & Plan:  Chelsea Snyder was seen today for annual exam.  Diagnoses and all orders for this visit:  Routine general medical examination at a health care facility Health maintenance discussed. Diet and exercise encouraged. Pt declined PAP today and order for mammogram, states she would like to follow up with GYN for her PAP and breast exam. Labs pending. Pt encouraged to follow up with GYN as  soon as possible for PAP.  -     CMP14+EGFR -     CBC with Differential/Platelet -     Lipid panel -     Thyroid Panel With TSH -     Ambulatory referral to Obstetrics / Gynecology  Intractable migraine with aura without status migrainosus Well controlled with current regimen. Pt encouraged to keep a headache log and to report any new or worsening symptoms.   BMI 34.0-34.9,adult Diet and exercise encouraged. Labs pending.  -     CMP14+EGFR -     CBC with Differential/Platelet -     Lipid panel -     Thyroid Panel With TSH  Seasonal allergic rhinitis due to pollen Well controlled with loratadine as needed, will continue.  Irregular menses Declined PAP today. Would like to see GYN. Referral placed and pt encouraged to follow up as soon as possible.  -     CBC with Differential/Platelet -     Ambulatory referral to Obstetrics / Gynecology  Need for tetanus booster -     Tdap vaccine greater than or equal to 7yo IM     Continue all other maintenance medications.  Follow up plan: Return in 1 year (on 10/10/2019), or if symptoms worsen or fail to improve.  Continue healthy lifestyle choices, including diet (rich in fruits, vegetables, and lean proteins, and low in salt and simple carbohydrates) and exercise (at least 30 minutes of moderate physical activity daily).  Educational handout given for health maintenance, diet  The above assessment and management plan was discussed with the patient. The patient verbalized understanding of and has agreed to the management plan. Patient is aware to call the clinic if they develop any new symptoms or if symptoms persist or worsen. Patient is aware when to return to the clinic for a follow-up visit. Patient educated on when it is appropriate to go to the emergency department.   Monia Pouch, FNP-C [facility] [phone]

## 2018-10-10 NOTE — Patient Instructions (Signed)
Health Maintenance, Female Adopting a healthy lifestyle and getting preventive care are important in promoting health and wellness. Ask your health care provider about:  The right schedule for you to have regular tests and exams.  Things you can do on your own to prevent diseases and keep yourself healthy. What should I know about diet, weight, and exercise? Eat a healthy diet   Eat a diet that includes plenty of vegetables, fruits, low-fat dairy products, and lean protein.  Do not eat a lot of foods that are high in solid fats, added sugars, or sodium. Maintain a healthy weight Body mass index (BMI) is used to identify weight problems. It estimates body fat based on height and weight. Your health care provider can help determine your BMI and help you achieve or maintain a healthy weight. Get regular exercise Get regular exercise. This is one of the most important things you can do for your health. Most adults should:  Exercise for at least 150 minutes each week. The exercise should increase your heart rate and make you sweat (moderate-intensity exercise).  Do strengthening exercises at least twice a week. This is in addition to the moderate-intensity exercise.  Spend less time sitting. Even light physical activity can be beneficial. Watch cholesterol and blood lipids Have your blood tested for lipids and cholesterol at 40 years of age, then have this test every 5 years. Have your cholesterol levels checked more often if:  Your lipid or cholesterol levels are high.  You are older than 40 years of age.  You are at high risk for heart disease. What should I know about cancer screening? Depending on your health history and family history, you may need to have cancer screening at various ages. This may include screening for:  Breast cancer.  Cervical cancer.  Colorectal cancer.  Skin cancer.  Lung cancer. What should I know about heart disease, diabetes, and high blood  pressure? Blood pressure and heart disease  High blood pressure causes heart disease and increases the risk of stroke. This is more likely to develop in people who have high blood pressure readings, are of African descent, or are overweight.  Have your blood pressure checked: ? Every 3-5 years if you are 54-9 years of age. ? Every year if you are 69 years old or older. Diabetes Have regular diabetes screenings. This checks your fasting blood sugar level. Have the screening done:  Once every three years after age 36 if you are at a normal weight and have a low risk for diabetes.  More often and at a younger age if you are overweight or have a high risk for diabetes. What should I know about preventing infection? Hepatitis B If you have a higher risk for hepatitis B, you should be screened for this virus. Talk with your health care provider to find out if you are at risk for hepatitis B infection. Hepatitis C Testing is recommended for:  Everyone born from [birth year] through 1967.  Anyone with known risk factors for hepatitis C. Sexually transmitted infections (STIs)  Get screened for STIs, including gonorrhea and chlamydia, if: ? You are sexually active and are younger than 40 years of age. ? You are older than 40 years of age and your health care provider tells you that you are at risk for this type of infection. ? Your sexual activity has changed since you were last screened, and you are at increased risk for chlamydia or gonorrhea. Ask your health care provider  if you are at risk.  Ask your health care provider about whether you are at high risk for HIV. Your health care provider may recommend a prescription medicine to help prevent HIV infection. If you choose to take medicine to prevent HIV, you should first get tested for HIV. You should then be tested every 3 months for as long as you are taking the medicine. Pregnancy  If you are about to stop having your period (premenopausal) and  you may become pregnant, seek counseling before you get pregnant.  Take 400 to 800 micrograms (mcg) of folic acid every day if you become pregnant.  Ask for birth control (contraception) if you want to prevent pregnancy. Osteoporosis and menopause Osteoporosis is a disease in which the bones lose minerals and strength with aging. This can result in bone fractures. If you are 284 years old or older, or if you are at risk for osteoporosis and fractures, ask your health care provider if you should:  Be screened for bone loss.  Take a calcium or vitamin D supplement to lower your risk of fractures.  Be given hormone replacement therapy (HRT) to treat symptoms of menopause. Follow these instructions at home: Lifestyle  Do not use any products that contain nicotine or tobacco, such as cigarettes, e-cigarettes, and chewing tobacco. If you need help quitting, ask your health care provider.  Do not use street drugs.  Do not share needles.  Ask your health care provider for help if you need support or information about quitting drugs. Alcohol use  Do not drink alcohol if: ? Your health care provider tells you not to drink. ? You are pregnant, may be pregnant, or are planning to become pregnant.  If you drink alcohol: ? Limit how much you use to 0-1 drink a day. ? Limit intake if you are breastfeeding.  Be aware of how much alcohol is in your drink. In the [place] one drink equals one 12 oz bottle of beer (355 mL), one 5 oz glass of wine (148 mL), or one 1 oz glass of hard liquor (44 mL). General instructions  Schedule regular health, dental, and eye exams.  Stay current with your vaccines.  Tell your health care provider if: ? You often feel depressed. ? You have ever been abused or do not feel safe at home. Summary  Adopting a healthy lifestyle and getting preventive care are important in promoting health and wellness.  Follow your health care provider's instructions about healthy  diet, exercising, and getting tested or screened for diseases.  Follow your health care provider's instructions on monitoring your cholesterol and blood pressure. This information is not intended to replace advice given to you by your health care provider. Make sure you discuss any questions you have with your health care provider. Document Released: 07/13/2010 Document Revised: 12/21/2017 Document Reviewed: 12/21/2017 Elsevier Patient Education  2020 [employer]   Fat and Cholesterol Restricted Eating Plan Eating a diet that limits fat and cholesterol may help lower your risk for heart disease and other conditions. Your body needs fat and cholesterol for basic functions, but eating too much of these things can be harmful to your health. Your health care provider may order lab tests to check your blood fat (lipid) and cholesterol levels. This helps your health care provider understand your risk for certain conditions and whether you need to make diet changes. Work with your health care provider or dietitian to make an eating plan that is right for you. Your  plan includes:  Limit your fat intake to ______% or less of your total calories a day.  Limit your saturated fat intake to ______% or less of your total calories a day.  Limit the amount of cholesterol in your diet to less than _________mg a day.  Eat ___________ g of fiber a day. What are tips for following this plan? General guidelines   If you are overweight, work with your health care provider to lose weight safely. Losing just 5-10% of your body weight can improve your overall health and help prevent diseases such as diabetes and heart disease.  Avoid: ? Foods with added sugar. ? Fried foods. ? Foods that contain partially hydrogenated oils, including stick margarine, some tub margarines, cookies, crackers, and other baked goods.  Limit alcohol intake to no more than 1 drink a day for nonpregnant women and 2 drinks a day for  men. One drink equals 12 oz of beer, 5 oz of wine, or 1 oz of hard liquor. Reading food labels  Check food labels for: ? Trans fats, partially hydrogenated oils, or high amounts of saturated fat. Avoid foods that contain saturated fat and trans fat. ? The amount of cholesterol in each serving. Try to eat no more than 200 mg of cholesterol each day. ? The amount of fiber in each serving. Try to eat at least 20-30 g of fiber each day.  Choose foods with healthy fats, such as: ? Monounsaturated and polyunsaturated fats. These include olive and canola oil, flaxseeds, walnuts, almonds, and seeds. ? Omega-3 fats. These are found in foods such as salmon, mackerel, sardines, tuna, flaxseed oil, and ground flaxseeds.  Choose grain products that have whole grains. Look for the word "whole" as the first word in the ingredient list. Cooking  Cook foods using methods other than frying. Baking, boiling, grilling, and broiling are some healthy options.  Eat more home-cooked food and less restaurant, buffet, and fast food.  Avoid cooking using saturated fats. ? Animal sources of saturated fats include meats, butter, and cream. ? Plant sources of saturated fats include palm oil, palm kernel oil, and coconut oil. Meal planning   At meals, imagine dividing your plate into fourths: ? Fill one-half of your plate with vegetables and green salads. ? Fill one-fourth of your plate with whole grains. ? Fill one-fourth of your plate with lean protein foods.  Eat fish that is high in omega-3 fats at least two times a week.  Eat more foods that contain fiber, such as whole grains, beans, apples, broccoli, carrots, peas, and barley. These foods help promote healthy cholesterol levels in the blood. Recommended foods Grains  Whole grains, such as whole wheat or whole grain breads, crackers, cereals, and pasta. Unsweetened oatmeal, bulgur, barley, quinoa, or brown rice. Corn or whole wheat flour tortillas.  Vegetables  Fresh or frozen vegetables (raw, steamed, roasted, or grilled). Green salads. Fruits  All fresh, canned (in natural juice), or frozen fruits. Meats and other protein foods  Ground beef (85% or leaner), grass-fed beef, or beef trimmed of fat. Skinless chicken or [place]. Ground chicken or [place]. Pork trimmed of fat. All fish and seafood. Egg whites. Dried beans, peas, or lentils. Unsalted nuts or seeds. Unsalted canned beans. Natural nut butters without added sugar and oil. Dairy  Low-fat or nonfat dairy products, such as skim or 1% milk, 2% or reduced-fat cheeses, low-fat and fat-free ricotta or cottage cheese, or plain low-fat and nonfat yogurt. Fats and oils  Tub margarine  without trans fats. Light or reduced-fat mayonnaise and salad dressings. Avocado. Olive, canola, sesame, or safflower oils. The items listed above may not be a complete list of recommended foods or beverages. Contact your dietitian for more options. Foods to avoid Grains  White bread. White pasta. White rice. Cornbread. Bagels, pastries, and croissants. Crackers and snack foods that contain trans fat and hydrogenated oils. Vegetables  Vegetables cooked in cheese, cream, or butter sauce. Fried vegetables. Fruits  Canned fruit in heavy syrup. Fruit in cream or butter sauce. Fried fruit. Meats and other protein foods  Fatty cuts of meat. Ribs, chicken wings, bacon, sausage, bologna, salami, chitterlings, fatback, hot dogs, bratwurst, and packaged lunch meats. Liver and organ meats. Whole eggs and egg yolks. Chicken and [place] with skin. Fried meat. Dairy  Whole or 2% milk, cream, half-and-half, and cream cheese. Whole milk cheeses. Whole-fat or sweetened yogurt. Full-fat cheeses. Nondairy creamers and whipped toppings. Processed cheese, cheese spreads, and cheese curds. Beverages  Alcohol. Sugar-sweetened drinks such as sodas, lemonade, and fruit drinks. Fats and oils  Butter, stick margarine, lard,  shortening, ghee, or bacon fat. Coconut, palm kernel, and palm oils. Sweets and desserts  Corn syrup, sugars, honey, and molasses. Candy. Jam and jelly. Syrup. Sweetened cereals. Cookies, pies, cakes, donuts, muffins, and ice cream. The items listed above may not be a complete list of foods and beverages to avoid. Contact your dietitian for more information. Summary  Your body needs fat and cholesterol for basic functions. However, eating too much of these things can be harmful to your health.  Work with your health care provider and dietitian to follow a diet low in fat and cholesterol. Doing this may help lower your risk for heart disease and other conditions.  Choose healthy fats, such as monounsaturated and polyunsaturated fats, and foods high in omega-3 fatty acids.  Eat fiber-rich foods, such as whole grains, beans, peas, fruits, and vegetables.  Limit or avoid alcohol, fried foods, and foods high in saturated fats, partially hydrogenated oils, and sugar. This information is not intended to replace advice given to you by your health care provider. Make sure you discuss any questions you have with your health care provider. Document Released: 12/28/2004 Document Revised: 12/10/2016 Document Reviewed: 09/14/2016 Elsevier Patient Education  2020 [employer].

## 2018-10-11 LAB — CMP14+EGFR
ALT: 9 IU/L (ref 0–32)
AST: 14 IU/L (ref 0–40)
Albumin/Globulin Ratio: 2.4 — ABNORMAL HIGH (ref 1.2–2.2)
Albumin: 4.5 g/dL (ref 3.8–4.8)
Alkaline Phosphatase: 44 IU/L (ref 39–117)
BUN/Creatinine Ratio: 13 (ref 9–23)
BUN: 8 mg/dL (ref 6–20)
Bilirubin Total: 0.2 mg/dL (ref 0.0–1.2)
CO2: 25 mmol/L (ref 20–29)
Calcium: 9.5 mg/dL (ref 8.7–10.2)
Chloride: 102 mmol/L (ref 96–106)
Creatinine, Ser: 0.61 mg/dL (ref 0.57–1.00)
GFR calc Af Amer: 132 mL/min/{1.73_m2} (ref >59)
GFR calc non Af Amer: 115 mL/min/{1.73_m2} (ref >59)
Globulin, Total: 1.9 g/dL (ref 1.5–4.5)
Glucose: 83 mg/dL (ref 65–99)
Potassium: 4.7 mmol/L (ref 3.5–5.2)
Sodium: 141 mmol/L (ref 134–144)
Total Protein: 6.4 g/dL (ref 6.0–8.5)

## 2018-10-11 LAB — CBC WITH DIFFERENTIAL/PLATELET
Basophils Absolute: 0 10*3/uL (ref 0.0–0.2)
Basos: 0 %
EOS (ABSOLUTE): 0.1 10*3/uL (ref 0.0–0.4)
Eos: 2 %
Hematocrit: 39.2 % (ref 34.0–46.6)
Hemoglobin: 13 g/dL (ref 11.1–15.9)
Immature Grans (Abs): 0 10*3/uL (ref 0.0–0.1)
Immature Granulocytes: 0 %
Lymphocytes Absolute: 2.2 10*3/uL (ref 0.7–3.1)
Lymphs: 30 %
MCH: 28.9 pg (ref 26.6–33.0)
MCHC: 33.2 g/dL (ref 31.5–35.7)
MCV: 87 fL (ref 79–97)
Monocytes Absolute: 0.5 10*3/uL (ref 0.1–0.9)
Monocytes: 7 %
Neutrophils Absolute: 4.6 10*3/uL (ref 1.4–7.0)
Neutrophils: 61 %
Platelets: 235 10*3/uL (ref 150–450)
RBC: 4.5 x10E6/uL (ref 3.77–5.28)
RDW: 12.6 % (ref 11.7–15.4)
WBC: 7.5 10*3/uL (ref 3.4–10.8)

## 2018-10-11 LAB — LIPID PANEL
Chol/HDL Ratio: 4.3 ratio (ref 0.0–4.4)
Cholesterol, Total: 195 mg/dL (ref 100–199)
HDL: 45 mg/dL (ref >39)
LDL Chol Calc (NIH): 121 mg/dL — ABNORMAL HIGH (ref 0–99)
Triglycerides: 166 mg/dL — ABNORMAL HIGH (ref 0–149)
VLDL Cholesterol Cal: 29 mg/dL (ref 5–40)

## 2018-10-11 LAB — THYROID PANEL WITH TSH
Free Thyroxine Index: 1.6 (ref 1.2–4.9)
T3 Uptake Ratio: 26 % (ref 24–39)
T4, Total: 6 ug/dL (ref 4.5–12.0)
TSH: 1.96 u[IU]/mL (ref 0.450–4.500)

## 2018-10-13 ENCOUNTER — Encounter: Payer: Self-pay | Admitting: *Deleted

## 2019-02-16 ENCOUNTER — Other Ambulatory Visit: Payer: Self-pay

## 2019-02-19 ENCOUNTER — Ambulatory Visit (INDEPENDENT_AMBULATORY_CARE_PROVIDER_SITE_OTHER): Payer: 59 | Admitting: Nurse Practitioner

## 2019-02-19 ENCOUNTER — Encounter: Payer: Self-pay | Admitting: Nurse Practitioner

## 2019-02-19 ENCOUNTER — Ambulatory Visit (INDEPENDENT_AMBULATORY_CARE_PROVIDER_SITE_OTHER): Payer: 59

## 2019-02-19 ENCOUNTER — Other Ambulatory Visit: Payer: Self-pay

## 2019-02-19 VITALS — BP 129/83 | HR 84 | Temp 98.6°F | Resp 20 | Ht 65.0 in | Wt 205.0 lb

## 2019-02-19 DIAGNOSIS — M25552 Pain in left hip: Secondary | ICD-10-CM

## 2019-02-19 MED ORDER — PREDNISONE 10 MG (21) PO TBPK: ORAL_TABLET | ORAL | 0 refills | Status: DC

## 2019-02-19 NOTE — Progress Notes (Signed)
   Subjective:    Patient ID: CHRISTANA ANGELICA, female    DOB: 25-Sep-1980, 39 y.o.   MRN: [id number]   Chief Complaint: Hip Pain (left  left foot feels tingly at times  Making popping sounds)   HPI Patient comes in today c/o left hip pain. Started last week with just a sore feeling. Then on Wednesday of last week she bent over to pick something up and she developed a sharp pain in it. She says that happened again 2 day sago and she had some numbness or tingling all the way down to her foot. Slight pain with walking. Pain increases when she lifts her leg up. She denies an injury. Rates pain 3-4 currently. Sh ehas tried heat and ibuprfen at home with slight relief.  Review of Systems  Constitutional: Negative for diaphoresis.  Eyes: Negative for pain.  Respiratory: Negative for shortness of breath.   Cardiovascular: Negative for chest pain, palpitations and leg swelling.  Gastrointestinal: Negative for abdominal pain.  Endocrine: Negative for polydipsia.  Skin: Negative for rash.  Neurological: Negative for dizziness, weakness and headaches.  Hematological: Does not bruise/bleed easily.  All other systems reviewed and are negative.      Objective:   Physical Exam Vitals and nursing note reviewed.  Constitutional:      Appearance: Normal appearance.  Cardiovascular:     Rate and Rhythm: Normal rate and regular rhythm.     Heart sounds: Normal heart sounds.  Pulmonary:     Breath sounds: Normal breath sounds.  Musculoskeletal:     Comments: From of left hip with pain on external and internal rotation.   Skin:    General: Skin is warm and dry.  Neurological:     General: No focal deficit present.     Mental Status: She is alert and oriented to person, place, and time.    BP 129/83   Pulse 84   Temp 98.6 F (37 C) (Temporal)   Resp 20   Ht 5\' 5"  (1.651 m)   Wt 205 lb (93 kg)   SpO2 92%   BMI 34.11 kg/m   Left hip xray- mild osteoathritis-Preliminary reading by , FNP  [facility]       Assessment & Plan:  [facility] in today with chief complaint of Hip Pain (left  left foot feels tingly at times  Making popping sounds)   1. Left hip pain Moist heat Rest No motrin while on steroids- can Daymon Larsen tylenol RTO prn - DG HIP UNILAT W OR W/O PELVIS 2-3 VIEWS LEFT; Future - predniSONE (STERAPRED UNI-PAK 21 TAB) 10 MG (21) TBPK tablet; As directed x 6 days  Dispense: 21 tablet; Refill: 0    The above assessment and management plan was discussed with the patient. The patient verbalized understanding of and has agreed to the management plan. Patient is aware to call the clinic if symptoms persist or worsen. Patient is aware when to return to the clinic for a follow-up visit. Patient educated on when it is appropriate to go to the emergency department.   Mary-Margaret [place], FNP

## 2019-02-19 NOTE — Patient Instructions (Signed)
Hip Pain The hip is the joint between the upper legs and the lower pelvis. The bones, cartilage, tendons, and muscles of your hip joint support your body and allow you to move around. Hip pain can range from a minor ache to severe pain in one or both of your hips. The pain may be felt on the inside of the hip joint near the groin, or on the outside near the buttocks and upper thigh. You may also have swelling or stiffness in your hip area. Follow these instructions at home: Managing pain, stiffness, and swelling      If directed, put ice on the painful area. To do this: ? Put ice in a plastic bag. ? Place a towel between your skin and the bag. ? Leave the ice on for 20 minutes, 2-3 times a day.  If directed, apply heat to the affected area as often as told by your health care provider. Use the heat source that your health care provider recommends, such as a moist heat pack or a heating pad. ? Place a towel between your skin and the heat source. ? Leave the heat on for 20-30 minutes. ? Remove the heat if your skin turns bright red. This is especially important if you are unable to feel pain, heat, or cold. You may have a greater risk of getting burned. Activity  Do exercises as told by your health care provider.  Avoid activities that cause pain. General instructions   Take over-the-counter and prescription medicines only as told by your health care provider.  Keep a journal of your symptoms. Write down: ? How often you have hip pain. ? The location of your pain. ? What the pain feels like. ? What makes the pain worse.  Sleep with a pillow between your legs on your most comfortable side.  Keep all follow-up visits as told by your health care provider. This is important. Contact a health care provider if:  You cannot put weight on your leg.  Your pain or swelling continues or gets worse after one week.  It gets harder to walk.  You have a fever. Get help right away  if:  You fall.  You have a sudden increase in pain and swelling in your hip.  Your hip is red or swollen or very tender to touch. Summary  Hip pain can range from a minor ache to severe pain in one or both of your hips.  The pain may be felt on the inside of the hip joint near the groin, or on the outside near the buttocks and upper thigh.  Avoid activities that cause pain.  Write down how often you have hip pain, the location of the pain, what makes it worse, and what it feels like. This information is not intended to replace advice given to you by your health care provider. Make sure you discuss any questions you have with your health care provider. Document Revised: 05/15/2018 Document Reviewed: 05/15/2018 Elsevier Patient Education  2020 Elsevier Inc. -- 

## 2020-03-14 ENCOUNTER — Telehealth: Payer: Self-pay

## 2020-03-17 NOTE — Telephone Encounter (Signed)
Called and person that was the wrong number no Mykal there.

## 2020-03-25 ENCOUNTER — Encounter: Payer: Self-pay | Admitting: Family Medicine

## 2020-03-25 ENCOUNTER — Other Ambulatory Visit: Payer: Self-pay

## 2020-03-25 ENCOUNTER — Ambulatory Visit (INDEPENDENT_AMBULATORY_CARE_PROVIDER_SITE_OTHER): Payer: 59 | Admitting: Family Medicine

## 2020-03-25 VITALS — BP 125/79 | HR 70 | Temp 97.5°F | Ht 65.0 in | Wt 219.0 lb

## 2020-03-25 DIAGNOSIS — E6609 Other obesity due to excess calories: Secondary | ICD-10-CM | POA: Diagnosis not present

## 2020-03-25 DIAGNOSIS — Z6836 Body mass index (BMI) 36.0-36.9, adult: Secondary | ICD-10-CM

## 2020-03-25 DIAGNOSIS — R202 Paresthesia of skin: Secondary | ICD-10-CM

## 2020-03-25 DIAGNOSIS — Z0001 Encounter for general adult medical examination with abnormal findings: Secondary | ICD-10-CM

## 2020-03-25 DIAGNOSIS — G43019 Migraine without aura, intractable, without status migrainosus: Secondary | ICD-10-CM | POA: Diagnosis not present

## 2020-03-25 DIAGNOSIS — Z0000 Encounter for general adult medical examination without abnormal findings: Secondary | ICD-10-CM

## 2020-03-25 DIAGNOSIS — G4700 Insomnia, unspecified: Secondary | ICD-10-CM | POA: Diagnosis not present

## 2020-03-25 DIAGNOSIS — F5104 Psychophysiologic insomnia: Secondary | ICD-10-CM | POA: Insufficient documentation

## 2020-03-25 MED ORDER — TRAZODONE HCL 50 MG PO TABS
25.0000 mg | ORAL_TABLET | Freq: Every evening | ORAL | 3 refills | Status: DC | PRN
Start: 2020-03-25 — End: 2020-05-01

## 2020-03-25 MED ORDER — VITAMIN D (ERGOCALCIFEROL) 1.25 MG (50000 UNIT) PO CAPS: 50000.0000 [IU] | ORAL_CAPSULE | ORAL | 0 refills | Status: DC

## 2020-03-25 MED ORDER — RIZATRIPTAN BENZOATE 10 MG PO TABS: 10.0000 mg | ORAL_TABLET | ORAL | 0 refills | Status: DC | PRN

## 2020-03-25 MED ORDER — PREDNISONE 10 MG (21) PO TBPK: ORAL_TABLET | ORAL | 0 refills | Status: DC

## 2020-03-25 NOTE — Patient Instructions (Addendum)
Health Maintenance, Female Adopting a healthy lifestyle and getting preventive care are important in promoting health and wellness. Ask your health care provider about:  The right schedule for you to have regular tests and exams.  Things you can do on your own to prevent diseases and keep yourself healthy. What should I know about diet, weight, and exercise? Eat a healthy diet  Eat a diet that includes plenty of vegetables, fruits, low-fat dairy products, and lean protein.  Do not eat a lot of foods that are high in solid fats, added sugars, or sodium.   Maintain a healthy weight Body mass index (BMI) is used to identify weight problems. It estimates body fat based on height and weight. Your health care provider can help determine your BMI and help you achieve or maintain a healthy weight. Get regular exercise Get regular exercise. This is one of the most important things you can do for your health. Most adults should:  Exercise for at least 150 minutes each week. The exercise should increase your heart rate and make you sweat (moderate-intensity exercise).  Do strengthening exercises at least twice a week. This is in addition to the moderate-intensity exercise.  Spend less time sitting. Even light physical activity can be beneficial. Watch cholesterol and blood lipids Have your blood tested for lipids and cholesterol at 42 years of age, then have this test every 5 years. Have your cholesterol levels checked more often if:  Your lipid or cholesterol levels are high.  You are older than 42 years of age.  You are at high risk for heart disease. What should I know about cancer screening? Depending on your health history and family history, you may need to have cancer screening at various ages. This may include screening for:  Breast cancer.  Cervical cancer.  Colorectal cancer.  Skin cancer.  Lung cancer. What should I know about heart disease, diabetes, and high blood  pressure? Blood pressure and heart disease  High blood pressure causes heart disease and increases the risk of stroke. This is more likely to develop in people who have high blood pressure readings, are of African descent, or are overweight.  Have your blood pressure checked: ? Every 3-5 years if you are 65-23 years of age. ? Every year if you are 4 years old or older. Diabetes Have regular diabetes screenings. This checks your fasting blood sugar level. Have the screening done:  Once every three years after age 64 if you are at a normal weight and have a low risk for diabetes.  More often and at a younger age if you are overweight or have a high risk for diabetes. What should I know about preventing infection? Hepatitis B If you have a higher risk for hepatitis B, you should be screened for this virus. Talk with your health care provider to find out if you are at risk for hepatitis B infection. Hepatitis C Testing is recommended for:  Everyone born from [birth year] through 1967.  Anyone with known risk factors for hepatitis C. Sexually transmitted infections (STIs)  Get screened for STIs, including gonorrhea and chlamydia, if: ? You are sexually active and are younger than 42 years of age. ? You are older than 42 years of age and your health care provider tells you that you are at risk for this type of infection. ? Your sexual activity has changed since you were last screened, and you are at increased risk for chlamydia or gonorrhea. Ask your health care provider  if you are at risk.  Ask your health care provider about whether you are at high risk for HIV. Your health care provider may recommend a prescription medicine to help prevent HIV infection. If you choose to take medicine to prevent HIV, you should first get tested for HIV. You should then be tested every 3 months for as long as you are taking the medicine. Pregnancy  If you are about to stop having your period (premenopausal) and  you may become pregnant, seek counseling before you get pregnant.  Take 400 to 800 micrograms (mcg) of folic acid every day if you become pregnant.  Ask for birth control (contraception) if you want to prevent pregnancy. Osteoporosis and menopause Osteoporosis is a disease in which the bones lose minerals and strength with aging. This can result in bone fractures. If you are 81 years old or older, or if you are at risk for osteoporosis and fractures, ask your health care provider if you should:  Be screened for bone loss.  Take a calcium or vitamin D supplement to lower your risk of fractures.  Be given hormone replacement therapy (HRT) to treat symptoms of menopause. Follow these instructions at home: Lifestyle  Do not use any products that contain nicotine or tobacco, such as cigarettes, e-cigarettes, and chewing tobacco. If you need help quitting, ask your health care provider.  Do not use street drugs.  Do not share needles.  Ask your health care provider for help if you need support or information about quitting drugs. Alcohol use  Do not drink alcohol if: ? Your health care provider tells you not to drink. ? You are pregnant, may be pregnant, or are planning to become pregnant.  If you drink alcohol: ? Limit how much you use to 0-1 drink a day. ? Limit intake if you are breastfeeding.  Be aware of how much alcohol is in your drink. In the [place] one drink equals one 12 oz bottle of beer (355 mL), one 5 oz glass of wine (148 mL), or one 1 oz glass of hard liquor (44 mL). General instructions  Schedule regular health, dental, and eye exams.  Stay current with your vaccines.  Tell your health care provider if: ? You often feel depressed. ? You have ever been abused or do not feel safe at home. Summary  Adopting a healthy lifestyle and getting preventive care are important in promoting health and wellness.  Follow your health care provider's instructions about healthy  diet, exercising, and getting tested or screened for diseases.  Follow your health care provider's instructions on monitoring your cholesterol and blood pressure. This information is not intended to replace advice given to you by your health care provider. Make sure you discuss any questions you have with your health care provider. Document Revised: 12/21/2017 Document Reviewed: 12/21/2017 Elsevier Patient Education  2021 Else[employer] Inc.     Why follow it? Research shows. . Those who follow the Mediterranean diet have a reduced risk of heart disease  . The diet is associated with a reduced incidence of Parkinson's and Alzheimer's diseases . People following the diet may have longer life expectancies and lower rates of chronic diseases  . The Dietary Guidelines for Americans recommends the Mediterranean diet as an eating plan to promote health and prevent disease  What Is the Mediterranean Diet?  . Healthy eating plan based on typical foods and recipes of Mediterranean-style cooking . The diet is primarily a plant based diet; these foods should make  up a majority of meals   Starches - Plant based foods should make up a majority of meals - They are an important sources of vitamins, minerals, energy, antioxidants, and fiber - Choose whole grains, foods high in fiber and minimally processed items  - Typical grain sources include wheat, oats, barley, corn, brown rice, bulgar, farro, millet, polenta, couscous  - Various types of beans include chickpeas, lentils, fava beans, black beans, white beans   Fruits  Veggies - Large quantities of antioxidant rich fruits & veggies; 6 or more servings  - Vegetables can be eaten raw or lightly drizzled with oil and cooked  - Vegetables common to the traditional Mediterranean Diet include: artichokes, arugula, beets, broccoli, brussel sprouts, cabbage, carrots, celery, collard greens, cucumbers, eggplant, kale, leeks, lemons, lettuce, mushrooms, okra, onions,  peas, peppers, potatoes, pumpkin, radishes, rutabaga, shallots, spinach, sweet potatoes, turnips, zucchini - Fruits common to the Mediterranean Diet include: apples, apricots, avocados, cherries, clementines, dates, figs, grapefruits, grapes, melons, nectarines, oranges, peaches, pears, pomegranates, strawberries, tangerines  Fats - Replace butter and margarine with healthy oils, such as olive oil, canola oil, and tahini  - Limit nuts to no more than a handful a day  - Nuts include walnuts, almonds, pecans, pistachios, pine nuts  - Limit or avoid candied, honey roasted or heavily salted nuts - Olives are central to the Mediterranean diet - can be eaten whole or used in a variety of dishes   Meats Protein - Limiting red meat: no more than a few times a month - When eating red meat: choose lean cuts and keep the portion to the size of deck of cards - Eggs: approx. 0 to 4 times a week  - Fish and lean poultry: at least 2 a week  - Healthy protein sources include, chicken, turkey, lean beef, lamb - Increase intake of seafood such as tuna, salmon, trout, mackerel, shrimp, scallops - Avoid or limit high fat processed meats such as sausage and bacon  Dairy - Include moderate amounts of low fat dairy products  - Focus on healthy dairy such as fat free yogurt, skim milk, low or reduced fat cheese - Limit dairy products higher in fat such as whole or 2% milk, cheese, ice cream  Alcohol - Moderate amounts of red wine is ok  - No more than 5 oz daily for women (all ages) and men older than age 65  - No more than 10 oz of wine daily for men younger than 65  Other - Limit sweets and other desserts  - Use herbs and spices instead of salt to flavor foods  - Herbs and spices common to the traditional Mediterranean Diet include: basil, bay leaves, chives, cloves, cumin, fennel, garlic, lavender, marjoram, mint, oregano, parsley, pepper, rosemary, sage, savory, sumac, tarragon, thyme   It's not just a diet,  it's a lifestyle:  . The Mediterranean diet includes lifestyle factors typical of those in the region  . Foods, drinks and meals are best eaten with others and savored . Daily physical activity is important for overall good health . This could be strenuous exercise like running and aerobics . This could also be more leisurely activities such as walking, housework, yard-work, or taking the stairs . Moderation is the key; a balanced and healthy diet accommodates most foods and drinks . Consider portion sizes and frequency of consumption of certain foods   Meal Ideas & Options:  . Breakfast:  o Whole wheat toast or whole wheat English muffins   with peanut butter & hard boiled egg o Steel cut oats topped with apples & cinnamon and skim milk  o Fresh fruit: banana, strawberries, melon, berries, peaches  o Smoothies: strawberries, bananas, greek yogurt, peanut butter o Low fat greek yogurt with blueberries and granola  o Egg white omelet with spinach and mushrooms o Breakfast couscous: whole wheat couscous, apricots, skim milk, cranberries  . Sandwiches:  o Hummus and grilled vegetables (peppers, zucchini, squash) on whole wheat bread   o Grilled chicken on whole wheat pita with lettuce, tomatoes, cucumbers or tzatziki  o Tuna salad on whole wheat bread: tuna salad made with greek yogurt, olives, red peppers, capers, green onions o Garlic rosemary lamb pita: lamb sauted with garlic, rosemary, salt & pepper; add lettuce, cucumber, greek yogurt to pita - flavor with lemon juice and black pepper  . Seafood:  o Mediterranean grilled salmon, seasoned with garlic, basil, parsley, lemon juice and black pepper  Chronic Migraine Headache A migraine is a type of headache that is usually stronger and more sudden than other headaches. Migraines are characterized by an intense pulsing, throbbing pain that is usually only present on one side of the head. Migraine pain usually gets worse with  activity. Migraines can cause nausea, vomiting, sensitivity to light and sound, and vision changes. Migraines that keep coming back are called recurrent migraines. A migraine is called a chronic migraine if it happens at least 15 days in a month for more than 3 months. Talk with your health care provider about what things may bring on (trigger) your migraines. What are the causes? The exact cause of this condition is not known. However, a migraine may be caused when nerves in the brain become irritated and release chemicals that cause inflammation of blood vessels. The inflammation of the blood vessels causes pain. Migraines may be triggered or caused by: Smoking. Certain foods and drinks, such as: Aged cheese. Chocolate. Alcohol. Caffeine. Foods or drinks that contain nitrates, glutamate, aspartame, MSG, or tyramine. Medicines, such as birth control pills or some blood pressure medicines. Other things that may trigger a migraine include: Menstruation. Emotional stress. Lack of sleep or too much sleep. Tiredness (fatigue). Bright lights or loud noises. Odors. Weather changes and high altitude. What increases the risk? The following factors may make you more likely to experience chronic migraine: Having migraines or a family history of migraines. Having a mental health condition, such as depression or anxiety. Having to take a lot of pain medicine. Having sleep problems. Having heart disease, diabetes, or obesity. What are the signs or symptoms? Symptoms of a migraine vary for each person and may include: Pulsating or throbbing pain. Pain that is usually only present on one side of the head. In some cases, the pain may be on both sides of the head or around the head or neck. Severe pain that prevents you from doing daily activities. Pain that gets worse with physical activity. Nausea, vomiting, or both. Pain with exposure to bright lights, loud noises, or activity. General  sensitivity to bright lights, loud noises, or smells. Dizziness. A sign that a migraine is becoming chronic is an increasing number of migraine episodes. It is considered chronic if the migraine happens at least 15 days in a month for more than 3 months. How is this diagnosed? This condition is often diagnosed based on: Your symptoms and medical history. A physical exam. You may also have tests, including: A CT scan or an MRI of your brain. These imaging  tests cannot diagnose migraines, but they can help to rule out other causes of headaches. Taking fluid from the spine (lumbar puncture) and analyzing it (cerebrospinal fluid analysis, or CSF analysis). Blood tests. How is this treated? This condition is treated with: Medicines. These help to: Lessen pain and nausea. Prevent migraines. Lifestyle changes, such as changes to your diet or sleeping patterns. Behavior therapy. This may include: Relaxation training. Biofeedback. This is a treatment that teaches you to relax and use your brain to lower your heart rate and control your breathing. Cognitive behavioral therapy (CBT). This is a form of talk therapy. This therapy helps you set goals and follow up on the changes that you make. Acupuncture. Using a device that provides electrical stimulation to your nerves, which can relieve pain (neuromodulation therapy). Surgery, if the other treatments are not working. Follow these instructions at home: Medicines Take over-the-counter and prescription medicines only as told by your health care provider. Ask your health care provider if the medicine prescribed to you requires you to avoid driving or using machinery. Lifestyle Do not use any products that contain nicotine or tobacco, such as cigarettes, e-cigarettes, and chewing tobacco. If you need help quitting, ask your health care provider. Do not drink alcohol. Get 7-9 hours of sleep each night, or the amount of sleep recommended by your health  care provider. Find ways to manage stress, such as meditation, deep breathing, or yoga. Maintain a healthy weight. If you need help losing weight, ask your health care provider. Exercise regularly. Aim for 150 minutes of moderate-intensity exercise, such as walking, biking, or yoga, or 75 minutes of vigorous exercise each week. Vigorous exercise includes running, circuit training, and swimming.   General instructions Keep a journal to find out what triggers your migraines so you can avoid these triggers. For example, write down: What you eat and drink. How much sleep you get. Any change to your diet or medicines. Lie down in a dark, quiet room when you have a migraine. Try placing a cool towel over your head when you have a migraine. Keep lights dim, if bright lights bother you or make your migraines worse. Keep all follow-up visits as told by your health care provider. This is important.   Where to find more information Coalition for Headache and Migraine Patients ([facility]): headachemigraine.org American Migraine Foundation: americanmigrainefoundation.org National Headache Foundation: headaches.org Contact a health care provider if: Your pain does not improve, even with medicine. Your migraines continue to return, even with medicine. Get help right away if: Your migraine becomes severe and medicine does not help. You have a stiff neck and fever. You have a loss of vision. You have muscle weakness or loss of muscle control. You start losing your balance, or you have trouble walking. You feel like you may faint, or you faint. You start having sudden and unexpected, severe headaches. You have a seizure. Summary Migraine headaches are usually stronger and more sudden than other headaches. Migraines are characterized by an intense pulsing, throbbing pain that is usually only present on one side of the head. Migraines that keep coming back are called recurrent migraines. A migraine is called  a chronic migraine if it happens 15 days in a month for more than 3 months. Certain things may trigger migraines, such as lack of sleep or too much sleep, smoking, certain foods, alcohol, stress, and certain medicines. Your treatment plan may include medicines, lifestyle changes, and behavior therapy. This information is not intended to replace advice given  to you by your health care provider. Make sure you discuss any questions you have with your health care provider. Document Revised: 02/14/2019 Document Reviewed: 02/14/2019 Elsevier Patient Education  2021 [employer] Inc[employer] o Shrimp, lemon, and spinach whole-grain pasta salad made with low fat greek yogurt  o Seared scallops with lemon orzo  o Seared tuna steaks seasoned salt, pepper, coriander topped with tomato mixture of olives, tomatoes, olive oil, minced garlic, parsley, green onions and cappers  . Meats:  o Herbed greek chicken salad with kalamata olives, cucumber, feta  o Red bell peppers stuffed with spinach, bulgur, lean ground beef (or lentils) & topped with feta   o Kebabs: skewers of chicken, tomatoes, onions, zucchini, squash  o [place] burgers: made with red onions, mint, dill, lemon juice, feta cheese topped with roasted red peppers . Vegetarian o Cucumber salad: cucumbers, artichoke hearts, celery, red onion, feta cheese, tossed in olive oil & lemon juice  o Hummus and whole grain pita points with a greek salad (lettuce, tomato, feta, olives, cucumbers, red onion) o Lentil soup with celery, carrots made with vegetable broth, garlic, salt and pepper  o Tabouli salad: parsley, bulgur, mint, scallions, cucumbers, tomato, radishes, lemon juice, olive oil, salt and pepper.

## 2020-03-25 NOTE — Progress Notes (Signed)
Chelsea Snyder is a 40 y.o. female presents to office today for annual physical exam examination.    Concerns today include: 1. Migraines Chelsea Snyder has a history of migraines. She reports a daily headache with at least 1 day a week with a major headache. She reports nausea and vertigo. The headaches are located all over. Her head feel hot and sweaty when she has a headache. She feels a lot of pressure with her headaches. She has has tried tylenol, Advil, and Excedrin with out improvement. She has tried immitrex in the past but this made her feel drunk.   2. Numbness Chelsea Snyder reports numbness in her first and second finger of her left hand x 2weeks. Denies injury, trauma, decreased ROM, erythema, or fever. She does have wrist pain at times.  3. Insomnia Chelsea Snyder reports difficulty sleeping. She takes tylenol PM nightly. This helps her fall asleep but she does not stay asleep.    Diet: working to improve diet for weight loss, Exercise: increasing for weight loss  Last mammogram: will go to GYN Last pap smear: will go to GYN   Social History   Socioeconomic History  . Marital status: Married    Spouse name: Not on file  . Number of children: Not on file  . Years of education: Not on file  . Highest education level: Not on file  Occupational History  . Not on file  Tobacco Use  . Smoking status: Never Smoker  . Smokeless tobacco: Never Used  Vaping Use  . Vaping Use: Never used  Substance and Sexual Activity  . Alcohol use: No  . Drug use: No  . Sexual activity: Not on file  Other Topics Concern  . Not on file  Social History Narrative  . Not on file   Social Determinants of Health   Financial Resource Strain: Not on file  Food Insecurity: Not on file  Transportation Needs: Not on file  Physical Activity: Not on file  Stress: Not on file  Social Connections: Not on file  Intimate Partner Violence: Not on file   Past Surgical History:  Procedure Laterality Date  .  CESAREAN SECTION     Family History  Problem Relation Age of Onset  . Cancer Mother        lung  . Stroke Father     Current Outpatient Medications:  .  diphenhydramine-acetaminophen (TYLENOL PM) 25-500 MG TABS tablet, Take 1 tablet by mouth at bedtime as needed., Disp: , Rfl:  .  SUMAtriptan (IMITREX) 100 MG tablet, Take 100 mg by mouth every 2 (two) hours as needed for migraine. May repeat in 2 hours if headache persists or recurs. (Patient not taking: Reported on 03/25/2020), Disp: , Rfl:   Allergies  Allergen Reactions  . Albuterol Itching  . Keflex [Cephalexin] Rash     ROS: Review of Systems Pertinent items noted in HPI and remainder of comprehensive ROS otherwise negative.    Physical exam BP 125/79   Pulse 70   Temp (!) 97.5 F (36.4 C) (Temporal)   Ht 5' 5" (1.651 m)   Wt 219 lb (99.3 kg)   BMI 36.44 kg/m  General appearance: alert and cooperative Head: Normocephalic, without obvious abnormality, atraumatic Eyes: conjunctivae/corneas clear. PERRL, EOM's intact. Fundi benign. Ears: normal TM's and external ear canals both ears Nose: Nares normal. Septum midline. Mucosa normal. No drainage or sinus tenderness. Throat: lips, mucosa, and tongue normal; teeth and gums normal Neck: no adenopathy, no carotid bruit, no  JVD, supple, symmetrical, trachea midline and thyroid not enlarged, symmetric, no tenderness/mass/nodules Lungs: clear to auscultation bilaterally Heart: regular rate and rhythm, S1, S2 normal, no murmur, click, rub or gallop Abdomen: soft, non-tender; bowel sounds normal; no masses,  no organomegaly  Musculoskeletal: + phalens bileratally  Extremities: extremities normal, atraumatic, no cyanosis or edema Skin: Skin color, texture, turgor normal. No rashes or lesions Neurologic: Alert and oriented X 3, normal strength and tone. Normal symmetric reflexes. Normal coordination and gait    Assessment/ Plan: Chelsea Snyder here for annual physical exam.    Chelsea Snyder was seen today for annual exam.  Diagnoses and all orders for this visit:  Routine general medical examination at a health care facility Labs pending as below.  -     CBC with Differential/Platelet -     CMP14+EGFR -     Lipid panel -     Thyroid Panel With TSH   Class 2 obesity due to excess calories with body mass index (BMI) of 36.0 to 36.9 in adult, unspecified whether serious comorbidity present Labs pending as below. Diet and exercise.  -     CBC with Differential/Platelet -     CMP14+EGFR -     Lipid panel -     Thyroid Panel With TSH  Paresthesia + phalen's bilaterally. Discussed carpal tunnel as cause. Prednisone pack ordered. Vitamin B12 level pending. Follow up in 6 weeks.  -     Vitamin B12 -     predniSONE (STERAPRED UNI-PAK 21 TAB) 10 MG (21) TBPK tablet; Use as directed on back of pill pack  Intractable migraine without aura and without status migrainosus Try rizatriptan. Failed immitrez. Samples of nurtec also given. Patient declined neurology referral today as she is terrified to have an MRI done. Discussed headache diary. Follow up in 6 weeks. -     rizatriptan (MAXALT) 10 MG tablet; Take 1 tablet (10 mg total) by mouth as needed for migraine. May repeat in 2 hours if needed  Insomnia, unspecified type Try trazodone as below. Follow up in 6 weeks.  -     traZODone (DESYREL) 50 MG tablet; Take 0.5-1 tablets (25-50 mg total) by mouth at bedtime as needed for sleep.  Counseled on healthy lifestyle choices, including diet (rich in fruits, vegetables and lean meats and low in salt and simple carbohydrates) and exercise (at least 30 minutes of moderate physical activity daily).  Patient to follow up in 1 year for annual exam or sooner if needed.  The above assessment and management plan was discussed with the patient. The patient verbalized understanding of and has agreed to the management plan. Patient is aware to call the clinic if symptoms persist or  worsen. Patient is aware when to return to the clinic for a follow-up visit. Patient educated on when it is appropriate to go to the emergency department.   Marjorie Smolder, FNP-C [facility] [address] [place], [place] [address] [phone]

## 2020-03-26 ENCOUNTER — Encounter: Payer: Self-pay | Admitting: Family Medicine

## 2020-03-26 LAB — THYROID PANEL WITH TSH
Free Thyroxine Index: 2.3 (ref 1.2–4.9)
T3 Uptake Ratio: 28 % (ref 24–39)
T4, Total: 8.3 ug/dL (ref 4.5–12.0)
TSH: 1.29 u[IU]/mL (ref 0.450–4.500)

## 2020-03-26 LAB — CMP14+EGFR
ALT: 9 IU/L (ref 0–32)
AST: 14 IU/L (ref 0–40)
Albumin/Globulin Ratio: 2 (ref 1.2–2.2)
Albumin: 4.5 g/dL (ref 3.8–4.8)
Alkaline Phosphatase: 39 IU/L — ABNORMAL LOW (ref 44–121)
BUN/Creatinine Ratio: 7 — ABNORMAL LOW (ref 9–23)
BUN: 5 mg/dL — ABNORMAL LOW (ref 6–24)
Bilirubin Total: 0.3 mg/dL (ref 0.0–1.2)
CO2: 24 mmol/L (ref 20–29)
Calcium: 9.5 mg/dL (ref 8.7–10.2)
Chloride: 101 mmol/L (ref 96–106)
Creatinine, Ser: 0.68 mg/dL (ref 0.57–1.00)
Globulin, Total: 2.2 g/dL (ref 1.5–4.5)
Glucose: 85 mg/dL (ref 65–99)
Potassium: 4.2 mmol/L (ref 3.5–5.2)
Sodium: 141 mmol/L (ref 134–144)
Total Protein: 6.7 g/dL (ref 6.0–8.5)
eGFR: 112 mL/min/{1.73_m2} (ref >59)

## 2020-03-26 LAB — CBC WITH DIFFERENTIAL/PLATELET
Basophils Absolute: 0 10*3/uL (ref 0.0–0.2)
Basos: 1 %
EOS (ABSOLUTE): 0.2 10*3/uL (ref 0.0–0.4)
Eos: 2 %
Hematocrit: 37 % (ref 34.0–46.6)
Hemoglobin: 11.7 g/dL (ref 11.1–15.9)
Immature Grans (Abs): 0 10*3/uL (ref 0.0–0.1)
Immature Granulocytes: 0 %
Lymphocytes Absolute: 2.5 10*3/uL (ref 0.7–3.1)
Lymphs: 31 %
MCH: 25.5 pg — ABNORMAL LOW (ref 26.6–33.0)
MCHC: 31.6 g/dL (ref 31.5–35.7)
MCV: 81 fL (ref 79–97)
Monocytes Absolute: 0.4 10*3/uL (ref 0.1–0.9)
Monocytes: 5 %
Neutrophils Absolute: 4.9 10*3/uL (ref 1.4–7.0)
Neutrophils: 61 %
Platelets: 247 10*3/uL (ref 150–450)
RBC: 4.59 x10E6/uL (ref 3.77–5.28)
RDW: 14.3 % (ref 11.7–15.4)
WBC: 8 10*3/uL (ref 3.4–10.8)

## 2020-03-26 LAB — LIPID PANEL
Chol/HDL Ratio: 4.3 ratio (ref 0.0–4.4)
Cholesterol, Total: 222 mg/dL — ABNORMAL HIGH (ref 100–199)
HDL: 52 mg/dL (ref >39)
LDL Chol Calc (NIH): 145 mg/dL — ABNORMAL HIGH (ref 0–99)
Triglycerides: 141 mg/dL (ref 0–149)
VLDL Cholesterol Cal: 25 mg/dL (ref 5–40)

## 2020-03-26 LAB — VITAMIN B12: Vitamin B-12: 435 pg/mL (ref 232–1245)

## 2020-05-01 ENCOUNTER — Encounter: Payer: Self-pay | Admitting: Family Medicine

## 2020-05-01 ENCOUNTER — Other Ambulatory Visit: Payer: Self-pay

## 2020-05-01 ENCOUNTER — Ambulatory Visit (INDEPENDENT_AMBULATORY_CARE_PROVIDER_SITE_OTHER): Payer: 59 | Admitting: Family Medicine

## 2020-05-01 VITALS — BP 104/63 | HR 59 | Temp 97.8°F | Ht 65.0 in | Wt 222.5 lb

## 2020-05-01 DIAGNOSIS — G43019 Migraine without aura, intractable, without status migrainosus: Secondary | ICD-10-CM | POA: Diagnosis not present

## 2020-05-01 DIAGNOSIS — G4700 Insomnia, unspecified: Secondary | ICD-10-CM

## 2020-05-01 DIAGNOSIS — R198 Other specified symptoms and signs involving the digestive system and abdomen: Secondary | ICD-10-CM | POA: Diagnosis not present

## 2020-05-01 MED ORDER — TRAZODONE HCL 50 MG PO TABS: 50.0000 mg | ORAL_TABLET | Freq: Every evening | ORAL | 3 refills | Status: DC | PRN

## 2020-05-01 MED ORDER — RIZATRIPTAN BENZOATE 10 MG PO TABS: 10.0000 mg | ORAL_TABLET | ORAL | 3 refills | Status: DC | PRN

## 2020-05-01 NOTE — Patient Instructions (Addendum)
Low-FODMAP Eating Plan  FODMAP stands for fermentable oligosaccharides, disaccharides, monosaccharides, and polyols. These are sugars that are hard for some people to digest. A low-FODMAP eating plan may help some people who have irritable bowel syndrome (IBS) and certain other bowel (intestinal) diseases to manage their symptoms. This meal plan can be complicated to follow. Work with a diet and nutrition specialist (dietitian) to make a low-FODMAP eating plan that is right for you. A dietitian can help make sure that you get enough nutrition from this diet. What are tips for following this plan? Reading food labels  Check labels for hidden FODMAPs such as: ? High-fructose syrup. ? Honey. ? Agave. ? Natural fruit flavors. ? Onion or garlic powder.  Choose low-FODMAP foods that contain 3-4 grams of fiber per serving.  Check food labels for serving sizes. Eat only one serving at a time to make sure FODMAP levels stay low. Shopping  Shop with a list of foods that are recommended on this diet and make a meal plan. Meal planning  Follow a low-FODMAP eating plan for up to 6 weeks, or as told by your health care provider or dietitian.  To follow the eating plan: 1. Eliminate high-FODMAP foods from your diet completely. Choose only low-FODMAP foods to eat. You will do this for 2-6 weeks. 2. Gradually reintroduce high-FODMAP foods into your diet one at a time. Most people should wait a few days before introducing the next new high-FODMAP food into their meal plan. Your dietitian can recommend how quickly you may reintroduce foods. 3. Keep a daily record of what and how much you eat and drink. Make note of any symptoms that you have after eating. 4. Review your daily record with a dietitian regularly to identify which foods you can eat and which foods you should avoid. General tips  Drink enough fluid each day to keep your urine pale yellow.  Avoid processed foods. These often have added sugar  and may be high in FODMAPs.  Avoid most dairy products, whole grains, and sweeteners.  Work with a dietitian to make sure you get enough fiber in your diet.  Avoid high FODMAP foods at meals to manage symptoms. Recommended foods Fruits Bananas, oranges, tangerines, lemons, limes, blueberries, raspberries, strawberries, grapes, cantaloupe, honeydew melon, kiwi, papaya, passion fruit, and pineapple. Limited amounts of dried cranberries, banana chips, and shredded coconut. Vegetables Eggplant, zucchini, cucumber, peppers, green beans, bean sprouts, lettuce, arugula, kale, Swiss chard, spinach, collard greens, bok choy, summer squash, potato, and tomato. Limited amounts of corn, carrot, and sweet potato. Green parts of scallions. Grains Gluten-free grains, such as rice, oats, buckwheat, quinoa, corn, polenta, and millet. Gluten-free pasta, bread, or cereal. Rice noodles. Corn tortillas. Meats and other proteins Unseasoned beef, pork, poultry, or fish. Eggs. Berniece Salines. Tofu (firm) and tempeh. Limited amounts of nuts and seeds, such as almonds, walnuts, [place] nuts, pecans, peanuts, nut butters, pumpkin seeds, chia seeds, and sunflower seeds. Dairy Lactose-free milk, yogurt, and kefir. Lactose-free cottage cheese and ice cream. Non-dairy milks, such as almond, coconut, hemp, and rice milk. Non-dairy yogurt. Limited amounts of goat cheese, brie, mozzarella, parmesan, swiss, and other hard cheeses. Fats and oils Butter-free spreads. Vegetable oils, such as olive, canola, and sunflower oil. Seasoning and other foods Artificial sweeteners with names that do not end in "ol," such as aspartame, saccharine, and stevia. Maple syrup, white table sugar, raw sugar, brown sugar, and molasses. Mayonnaise, soy sauce, and tamari. Fresh basil, coriander, parsley, rosemary, and thyme. Beverages Water and  mineral water. Sugar-sweetened soft drinks. Small amounts of orange juice or cranberry juice. Black and green tea.  Most dry wines. Coffee. The items listed above may not be a complete list of foods and beverages you can eat. Contact a dietitian for more information. Foods to avoid Fruits Fresh, dried, and juiced forms of apple, pear, watermelon, peach, plum, cherries, apricots, blackberries, boysenberries, figs, nectarines, and mango. Avocado. Vegetables Chicory root, artichoke, asparagus, cabbage, snow peas, Brussels sprouts, broccoli, sugar snap peas, mushrooms, celery, and cauliflower. Onions, garlic, leeks, and the white part of scallions. Grains Wheat, including kamut, durum, and semolina. Barley and bulgur. Couscous. Wheat-based cereals. Wheat noodles, bread, crackers, and pastries. Meats and other proteins Fried or fatty meat. Sausage. Cashews and pistachios. Soybeans, baked beans, black beans, chickpeas, kidney beans, fava beans, navy beans, lentils, black-eyed peas, and split peas. Dairy Milk, yogurt, ice cream, and soft cheese. Cream and sour cream. Milk-based sauces. Custard. Buttermilk. Soy milk. Seasoning and other foods Any sugar-free gum or candy. Foods that contain artificial sweeteners such as sorbitol, mannitol, isomalt, or xylitol. Foods that contain honey, high-fructose corn syrup, or agave. Bouillon, vegetable stock, beef stock, and chicken stock. Garlic and onion powder. Condiments made with onion, such as hummus, chutney, pickles, relish, salad dressing, and salsa. Tomato paste. Beverages Chicory-based drinks. Coffee substitutes. Chamomile tea. Fennel tea. Sweet or fortified wines such as port or sherry. Diet soft drinks made with isomalt, mannitol, maltitol, sorbitol, or xylitol. Apple, pear, and mango juice. Juices with high-fructose corn syrup. The items listed above may not be a complete list of foods and beverages you should avoid. Contact a dietitian for more information. Summary  FODMAP stands for fermentable oligosaccharides, disaccharides, monosaccharides, and polyols. These  are sugars that are hard for some people to digest.  A low-FODMAP eating plan is a short-term diet that helps to ease symptoms of certain bowel diseases.  The eating plan usually lasts up to 6 weeks. After that, high-FODMAP foods are reintroduced gradually and one at a time. This can help you find out which foods may be causing symptoms.  A low-FODMAP eating plan can be complicated. It is best to work with a dietitian who has experience with this type of plan. This information is not intended to replace advice given to you by your health care provider. Make sure you discuss any questions you have with your health care provider. Document Revised: 05/17/2019 Document Reviewed: 05/17/2019 Elsevier Patient Education  2021 [employer]  Irritable Bowel Syndrome, Adult  Irritable bowel syndrome (IBS) is a group of symptoms that affects the organs responsible for digestion (gastrointestinal or GI tract). IBS is not one specific disease. To regulate how the GI tract works, the body sends signals back and forth between the intestines and the brain. If you have IBS, there may be a problem with these signals. As a result, the GI tract does not function normally. The intestines may become more sensitive and overreact to certain things. This may be especially true when you eat certain foods or when you are under stress. There are four types of IBS. These may be determined based on the consistency of your stool (feces):  IBS with diarrhea.  IBS with constipation.  Mixed IBS.  Unsubtyped IBS. It is important to know which type of IBS you have. Certain treatments are more likely to be helpful for certain types of IBS. What are the causes? The exact cause of IBS is not known. What increases the risk? You may have   a higher risk for IBS if you:  Are female.  Are younger than 40.  Have a family history of IBS.  Have a mental health condition, such as depression, anxiety, or post-traumatic stress  disorder.  Have had a bacterial infection of your GI tract. What are the signs or symptoms? Symptoms of IBS vary from person to person. The main symptom is abdominal pain or discomfort. Other symptoms usually include one or more of the following:  Diarrhea, constipation, or both.  Abdominal swelling or bloating.  Feeling full after eating a small or regular-sized meal.  Frequent gas.  Mucus in the stool.  A feeling of having more stool left after a bowel movement. Symptoms tend to come and go. They may be triggered by stress, mental health conditions, or certain foods. How is this diagnosed? This condition may be diagnosed based on a physical exam, your medical history, and your symptoms. You may have tests, such as:  Blood tests.  Stool test.  X-rays.  CT scan.  Colonoscopy. This is a procedure in which your GI tract is viewed with a long, thin, flexible tube. How is this treated? There is no cure for IBS, but treatment can help relieve symptoms. Treatment depends on the type of IBS you have, and may include:  Changes to your diet, such as: ? Avoiding foods that cause symptoms. ? Drinking more water. ? Following a low-FODMAP (fermentable oligosaccharides, disaccharides, monosaccharides, and polyols) diet for up to 6 weeks, or as told by your health care provider. FODMAPs are sugars that are hard for some people to digest. ? Eating more fiber. ? Eating medium-sized meals at the same times every day.  Medicines. These may include: ? Fiber supplements, if you have constipation. ? Medicine to control diarrhea (antidiarrheal medicines). ? Medicine to help control muscle tightening (spasms) in your GI tract (antispasmodic medicines). ? Medicines to help with mental health conditions, such as antidepressants or tranquilizers.  Talk therapy or counseling.  Working with a diet and nutrition specialist (dietitian) to help create a food plan that is right for you.  Managing  your stress. Follow these instructions at home: Eating and drinking  Eat a healthy diet.  Eat medium-sized meals at about the same time every day. Do not eat large meals.  Gradually eat more fiber-rich foods. These include whole grains, fruits, and vegetables. This may be especially helpful if you have IBS with constipation.  Eat a diet low in FODMAPs.  Drink enough fluid to keep your urine pale yellow.  Keep a journal of foods that seem to trigger symptoms.  Avoid foods and drinks that: ? Contain added sugar. ? Make your symptoms worse. Dairy products, caffeinated drinks, and carbonated drinks can make symptoms worse for some people. General instructions  Take over-the-counter and prescription medicines and supplements only as told by your health care provider.  Get enough exercise. Do at least 150 minutes of moderate-intensity exercise each week.  Manage your stress. Getting enough sleep and exercise can help you manage stress.  Keep all follow-up visits as told by your health care provider and therapist. This is important. Alcohol Use  Do not drink alcohol if: ? Your health care provider tells you not to drink. ? You are pregnant, may be pregnant, or are planning to become pregnant.  If you drink alcohol, limit how much you have: ? 0-1 drink a day for women. ? 0-2 drinks a day for men.  Be aware of how much alcohol is in   your drink. In the [place] one drink equals one typical bottle of beer (12 oz), one-half glass of wine (5 oz), or one shot of hard liquor (1 oz). Contact a health care provider if you have:  Constant pain.  Weight loss.  Difficulty or pain when swallowing.  Diarrhea that gets worse. Get help right away if you have:  Severe abdominal pain.  Fever.  Diarrhea with symptoms of dehydration, such as dizziness or dry mouth.  Bright red blood in your stool.  Stool that is black and tarry.  Abdominal swelling.  Vomiting that does not  stop.  Blood in your vomit. Summary  Irritable bowel syndrome (IBS) is not one specific disease. It is a group of symptoms that affects digestion.  Your intestines may become more sensitive and overreact to certain things. This may be especially true when you eat certain foods or when you are under stress.  There is no cure for IBS, but treatment can help relieve symptoms. This information is not intended to replace advice given to you by your health care provider. Make sure you discuss any questions you have with your health care provider. Document Revised: 08/30/2019 Document Reviewed: 08/30/2019 Elsevier Patient Education  2021 [employer]  

## 2020-05-01 NOTE — Progress Notes (Signed)
Acute Office Visit  Subjective:    Patient ID: Chelsea Snyder, female    DOB: [birth year], 40 y.o.   MRN: [id number]  Chief Complaint  Patient presents with  . GI Problem    HPI Patient is in today for alternating constipation and diarrhea for years. It feels like she the past year has been worse. She reports that after she has a bad doubt of diarrhea, she will have an episode of diarrhea. She mostly has episodes of diarrhea. This may happen after each meal and may occur 4-5 times a day. Denies bleeding, fever, nausea, or vomiting. She has some occasionally abdominal pain when she has diarrhea. She has tried a fiber supplement before without improvement. She occasionally takes a stool softener when she is constipated, but this is infrequently.   Maxalt has been working well for her migraines. She has taken the rizatriptan about 5 times. Denies significant side effects. She has stopped taking tylenol each night. She has had less headaches since doing this.   Tazadone has been somewhat helpful. She reports that the 25 mg tablet was not helpful at all. She is able to fall asleep pretty easily with the 50 mg tablet but then she wakes up about 3-4 am and cannot fall back asleep. She denies any side effects from the medication.   Past Medical History:  Diagnosis Date  . Hyperlipidemia   . Migraine     Past Surgical History:  Procedure Laterality Date  . CESAREAN SECTION      Family History  Problem Relation Age of Onset  . Cancer Mother        lung  . Stroke Father     Social History   Socioeconomic History  . Marital status: Married    Spouse name: Not on file  . Number of children: Not on file  . Years of education: Not on file  . Highest education level: Not on file  Occupational History  . Not on file  Tobacco Use  . Smoking status: Never Smoker  . Smokeless tobacco: Never Used  Vaping Use  . Vaping Use: Never used  Substance and Sexual Activity  . Alcohol use: No   . Drug use: No  . Sexual activity: Not on file  Other Topics Concern  . Not on file  Social History Narrative  . Not on file   Social Determinants of Health   Financial Resource Strain: Not on file  Food Insecurity: Not on file  Transportation Needs: Not on file  Physical Activity: Not on file  Stress: Not on file  Social Connections: Not on file  Intimate Partner Violence: Not on file    Outpatient Medications Prior to Visit  Medication Sig Dispense Refill  . rizatriptan (MAXALT) 10 MG tablet Take 1 tablet (10 mg total) by mouth as needed for migraine. May repeat in 2 hours if needed 10 tablet 0  . traZODone (DESYREL) 50 MG tablet Take 0.5-1 tablets (25-50 mg total) by mouth at bedtime as needed for sleep. 30 tablet 3  . diphenhydramine-acetaminophen (TYLENOL PM) 25-500 MG TABS tablet Take 1 tablet by mouth at bedtime as needed.    . predniSONE (STERAPRED UNI-PAK 21 TAB) 10 MG (21) TBPK tablet Use as directed on back of pill pack 21 tablet 0   No facility-administered medications prior to visit.    Allergies  Allergen Reactions  . Albuterol Itching  . Keflex [Cephalexin] Rash    Review of Systems As per HPI.  Objective:    Physical Exam Vitals and nursing note reviewed.  Constitutional:      General: She is not in acute distress.    Appearance: Normal appearance. She is not ill-appearing, toxic-appearing or diaphoretic.  HENT:     Head: Normocephalic and atraumatic.  Cardiovascular:     Rate and Rhythm: Normal rate and regular rhythm.     Heart sounds: Normal heart sounds. No murmur heard.   Pulmonary:     Effort: Pulmonary effort is normal. No respiratory distress.     Breath sounds: Normal breath sounds.  Musculoskeletal:     Right lower leg: No edema.     Left lower leg: No edema.  Skin:    General: Skin is warm and dry.  Neurological:     General: No focal deficit present.     Mental Status: She is alert and oriented to person, place, and time.   Psychiatric:        Mood and Affect: Mood normal.        Behavior: Behavior normal.        Thought Content: Thought content normal.        Judgment: Judgment normal.     BP 104/63   Pulse (!) 59   Temp 97.8 F (36.6 C) (Temporal)   Ht 5\' 5"  (1.651 m)   Wt 222 lb 8 oz (100.9 kg)   BMI 37.03 kg/m  Wt Readings from Last 3 Encounters:  05/01/20 222 lb 8 oz (100.9 kg)  03/25/20 219 lb (99.3 kg)  02/19/19 205 lb (93 kg)    Health Maintenance Due  Topic Date Due  . PAP SMEAR-Modifier  Never done    There are no preventive care reminders to display for this patient.   Lab Results  Component Value Date   TSH 1.290 03/25/2020   Lab Results  Component Value Date   WBC 8.0 03/25/2020   HGB 11.7 03/25/2020   HCT 37.0 03/25/2020   MCV 81 03/25/2020   PLT 247 03/25/2020   Lab Results  Component Value Date   NA 141 03/25/2020   K 4.2 03/25/2020   CO2 24 03/25/2020   GLUCOSE 85 03/25/2020   BUN 5 (L) 03/25/2020   CREATININE 0.68 03/25/2020   BILITOT 0.3 03/25/2020   ALKPHOS 39 (L) 03/25/2020   AST 14 03/25/2020   ALT 9 03/25/2020   PROT 6.7 03/25/2020   ALBUMIN 4.5 03/25/2020   CALCIUM 9.5 03/25/2020   Lab Results  Component Value Date   CHOL 222 (H) 03/25/2020   Lab Results  Component Value Date   HDL 52 03/25/2020   Lab Results  Component Value Date   LDLCALC 145 (H) 03/25/2020   Lab Results  Component Value Date   TRIG 141 03/25/2020   Lab Results  Component Value Date   CHOLHDL 4.3 03/25/2020   No results found for: HGBA1C     Assessment & Plan:   Chelsea Snyder was seen today for gi problem.  Diagnoses and all orders for this visit:  Alternating constipation and diarrhea Discussed possibility of IBS based on history. Discussed diet changes and fiber supplement. Also discussed imodium prn. She declined GI referral today. She will let me know if she changes her mind about this.   Intractable migraine without aura and without status  migrainosus Well controlled on current regimen.  -     rizatriptan (MAXALT) 10 MG tablet; Take 1 tablet (10 mg total) by mouth as needed for migraine. May repeat  in 2 hours if needed  Insomnia, unspecified type Improving but not controlled. Increased to 75-100 mg as needed. If still uncontrolled with increased dosage, or if she experiences side effects with increased dosage, will try doxepin.  -     traZODone (DESYREL) 50 MG tablet; Take 1-2 tablets (50-100 mg total) by mouth at bedtime as needed for sleep.   The patient indicates understanding of these issues and agrees with the plan.  Return in about 6 months (around 10/31/2020) for chronic follow up .   Gabriel Earing, FNP

## 2020-06-08 ENCOUNTER — Encounter: Payer: Self-pay | Admitting: Family Medicine

## 2020-08-02 IMAGING — DX DG HIP (WITH OR WITHOUT PELVIS) 2-3V*L*
3 series · 3 of 3 positions shown · non-contrast
Comparison: None.

CLINICAL DATA: Left hip pain.

EXAM:
DG HIP (WITH OR WITHOUT PELVIS) 2-3V LEFT

[hip lat]
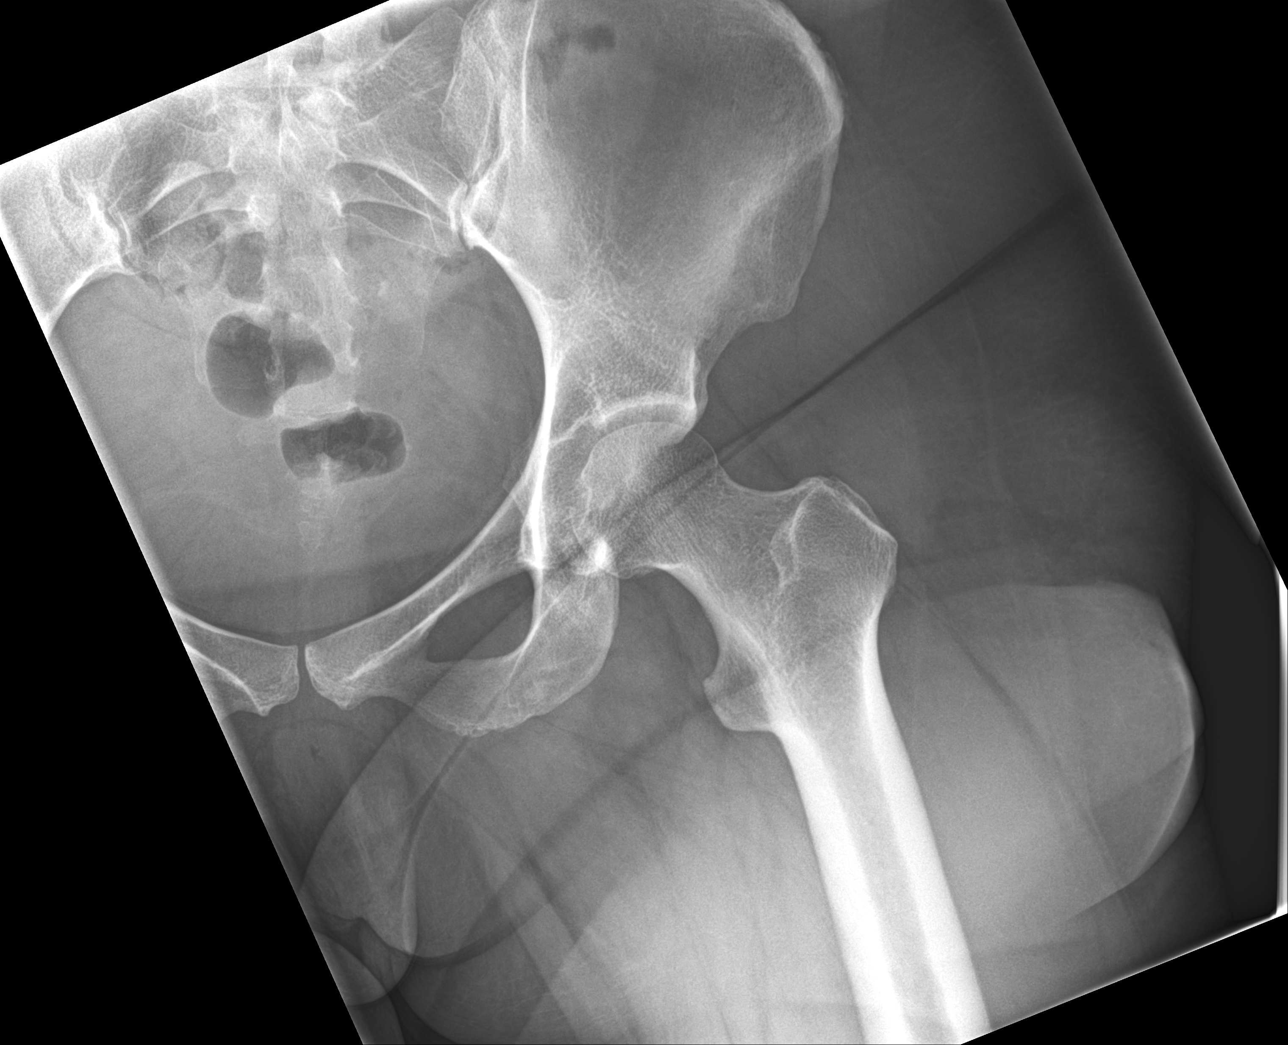

[pelvis ap]
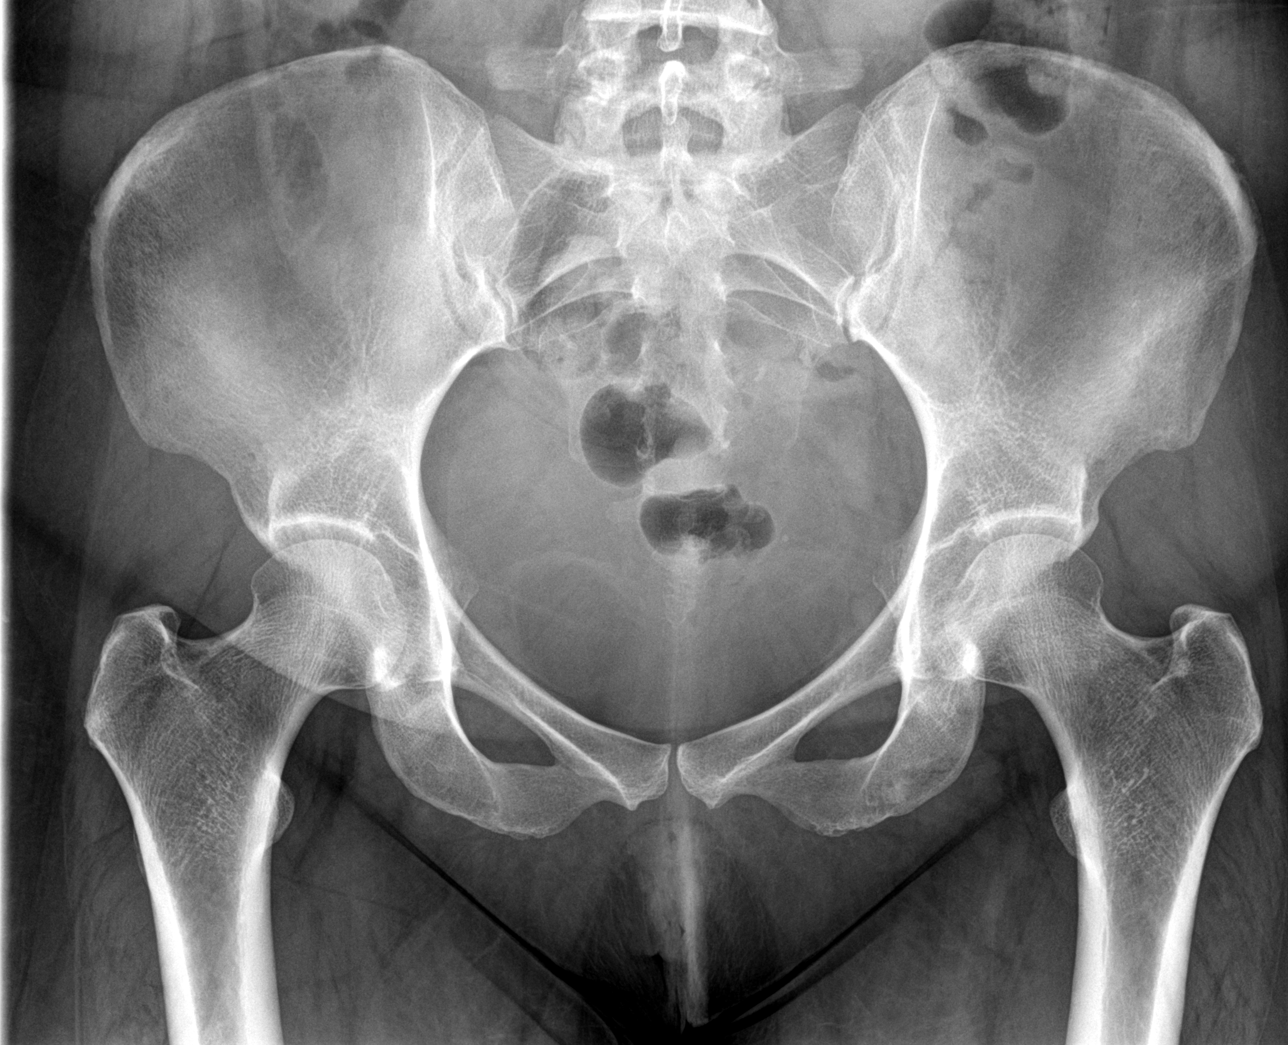

[hip ap]
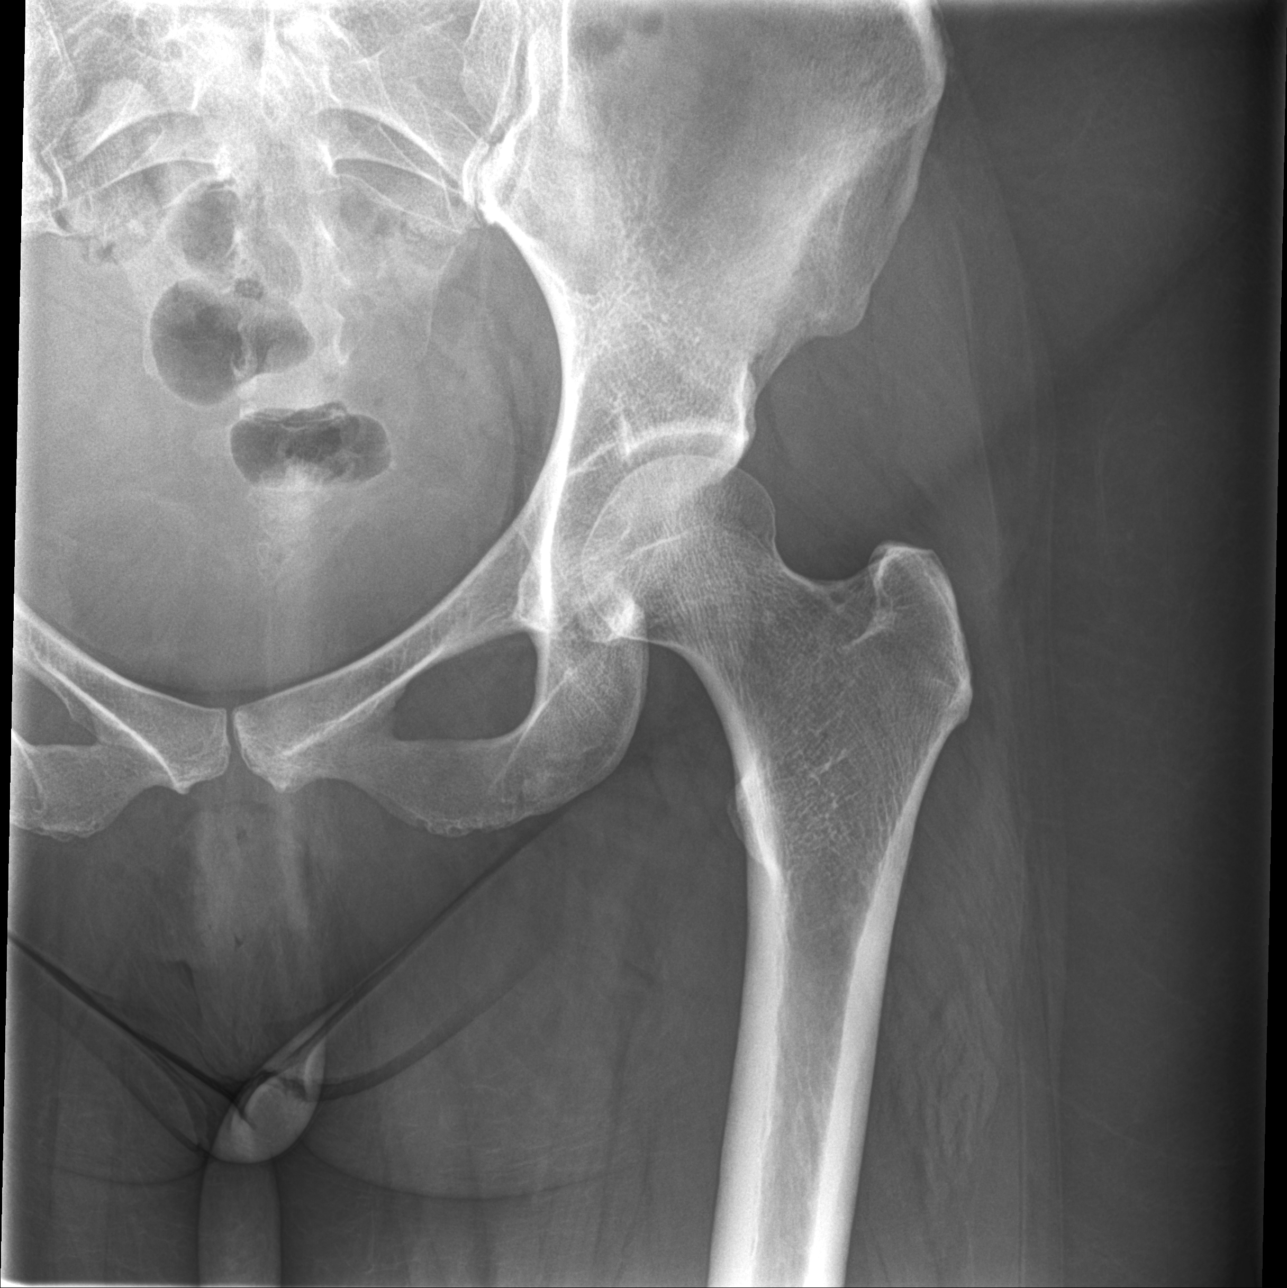

[3 of 3 positions shown; findings below may reference images not displayed]

FINDINGS: There is no evidence of hip fracture or dislocation. There is no
evidence of arthropathy or other focal bone abnormality.
IMPRESSION: Negative.

## 2020-10-04 ENCOUNTER — Other Ambulatory Visit: Payer: Self-pay | Admitting: Family Medicine

## 2020-10-04 DIAGNOSIS — G4700 Insomnia, unspecified: Secondary | ICD-10-CM

## 2020-10-31 ENCOUNTER — Ambulatory Visit (INDEPENDENT_AMBULATORY_CARE_PROVIDER_SITE_OTHER): Payer: 59 | Admitting: Family Medicine

## 2020-10-31 ENCOUNTER — Encounter: Payer: Self-pay | Admitting: Family Medicine

## 2020-10-31 ENCOUNTER — Other Ambulatory Visit: Payer: Self-pay

## 2020-10-31 VITALS — BP 130/71 | HR 77 | Temp 97.3°F | Ht 65.0 in | Wt 226.4 lb

## 2020-10-31 DIAGNOSIS — G43009 Migraine without aura, not intractable, without status migrainosus: Secondary | ICD-10-CM | POA: Diagnosis not present

## 2020-10-31 DIAGNOSIS — F5101 Primary insomnia: Secondary | ICD-10-CM

## 2020-10-31 DIAGNOSIS — E6609 Other obesity due to excess calories: Secondary | ICD-10-CM | POA: Diagnosis not present

## 2020-10-31 DIAGNOSIS — E7841 Elevated Lipoprotein(a): Secondary | ICD-10-CM

## 2020-10-31 DIAGNOSIS — Z6836 Body mass index (BMI) 36.0-36.9, adult: Secondary | ICD-10-CM

## 2020-10-31 MED ORDER — RIZATRIPTAN BENZOATE 10 MG PO TABS
10.0000 mg | ORAL_TABLET | ORAL | 3 refills | Status: DC | PRN
Start: 2020-10-31 — End: 2021-11-16

## 2020-10-31 MED ORDER — TRAZODONE HCL 100 MG PO TABS: 100.0000 mg | ORAL_TABLET | Freq: Every day | ORAL | 3 refills | Status: DC

## 2020-10-31 MED ORDER — PHENTERMINE HCL 37.5 MG PO TABS: 37.5000 mg | ORAL_TABLET | Freq: Every day | ORAL | 0 refills | Status: DC

## 2020-10-31 NOTE — Progress Notes (Signed)
Established Patient Office Visit  Subjective:  Patient ID: Chelsea Snyder, female    DOB: 1980/06/24  Age: 40 y.o. MRN: [id number]  CC:  Chief Complaint  Patient presents with   Medical Management of Chronic Issues    HPI Chelsea Snyder presents for chronic follow up.   She reports migraines are better. She takes rizatritan about once a month with good relief.   She has been taking 75 mg of trazodone. This works fairly well.   She has exercising by hiking 2 miles 4x a week. She has been to a nutrition class.  She has added more lean protein in her diet. She has cut out sweets and sodas. Despite these changes, she has not had any weight loss.   Wt Readings from Last 3 Encounters:  10/31/20 226 lb 6 oz (102.7 kg)  05/01/20 222 lb 8 oz (100.9 kg)  03/25/20 219 lb (99.3 kg)     Past Medical History:  Diagnosis Date   Hyperlipidemia    Migraine     Past Surgical History:  Procedure Laterality Date   CESAREAN SECTION      Family History  Problem Relation Age of Onset   Cancer Mother        lung   Stroke Father     Social History   Socioeconomic History   Marital status: Married    Spouse name: Not on file   Number of children: Not on file   Years of education: Not on file   Highest education level: Not on file  Occupational History   Not on file  Tobacco Use   Smoking status: Never   Smokeless tobacco: Never  Vaping Use   Vaping Use: Never used  Substance and Sexual Activity   Alcohol use: No   Drug use: No   Sexual activity: Not on file  Other Topics Concern   Not on file  Social History Narrative   Not on file   Social Determinants of Health   Financial Resource Strain: Not on file  Food Insecurity: Not on file  Transportation Needs: Not on file  Physical Activity: Not on file  Stress: Not on file  Social Connections: Not on file  Intimate Partner Violence: Not on file    Outpatient Medications Prior to Visit  Medication Sig Dispense  Refill   rizatriptan (MAXALT) 10 MG tablet Take 1 tablet (10 mg total) by mouth as needed for migraine. May repeat in 2 hours if needed 10 tablet 3   traZODone (DESYREL) 50 MG tablet TAKE 1 TO 2 TABLETS BY MOUTH AT BEDTIME AS NEEDED FOR SLEEP 30 tablet 0   No facility-administered medications prior to visit.    Allergies  Allergen Reactions   Albuterol Itching   Keflex [Cephalexin] Rash    ROS Review of Systems Negative unless specially indicated above in HPI.    Objective:    Physical Exam Vitals and nursing note reviewed.  Constitutional:      General: She is not in acute distress.    Appearance: She is not ill-appearing, toxic-appearing or diaphoretic.  Cardiovascular:     Rate and Rhythm: Normal rate and regular rhythm.     Heart sounds: Normal heart sounds. No murmur heard. Pulmonary:     Effort: Pulmonary effort is normal. No respiratory distress.     Breath sounds: Normal breath sounds.  Musculoskeletal:     Right lower leg: No edema.     Left lower leg: No edema.  Skin:    General: Skin is warm and dry.  Neurological:     General: No focal deficit present.     Mental Status: She is alert and oriented to person, place, and time.  Psychiatric:        Mood and Affect: Mood normal.        Behavior: Behavior normal.    BP 130/71   Pulse 77   Temp (!) 97.3 F (36.3 C) (Temporal)   Ht '5\' 5"'  (1.651 m)   Wt 226 lb 6 oz (102.7 kg)   BMI 37.67 kg/m  Wt Readings from Last 3 Encounters:  10/31/20 226 lb 6 oz (102.7 kg)  05/01/20 222 lb 8 oz (100.9 kg)  03/25/20 219 lb (99.3 kg)     Health Maintenance Due  Topic Date Due   PAP SMEAR-Modifier  Never done    There are no preventive care reminders to display for this patient.  Lab Results  Component Value Date   TSH 1.290 03/25/2020   Lab Results  Component Value Date   WBC 8.0 03/25/2020   HGB 11.7 03/25/2020   HCT 37.0 03/25/2020   MCV 81 03/25/2020   PLT 247 03/25/2020   Lab Results  Component  Value Date   NA 141 03/25/2020   K 4.2 03/25/2020   CO2 24 03/25/2020   GLUCOSE 85 03/25/2020   BUN 5 (L) 03/25/2020   CREATININE 0.68 03/25/2020   BILITOT 0.3 03/25/2020   ALKPHOS 39 (L) 03/25/2020   AST 14 03/25/2020   ALT 9 03/25/2020   PROT 6.7 03/25/2020   ALBUMIN 4.5 03/25/2020   CALCIUM 9.5 03/25/2020   EGFR 112 03/25/2020   Lab Results  Component Value Date   CHOL 222 (H) 03/25/2020   Lab Results  Component Value Date   HDL 52 03/25/2020   Lab Results  Component Value Date   LDLCALC 145 (H) 03/25/2020   Lab Results  Component Value Date   TRIG 141 03/25/2020   Lab Results  Component Value Date   CHOLHDL 4.3 03/25/2020   No results found for: HGBA1C    Assessment & Plan:   Chelsea Snyder was seen today for medical management of chronic issues.  Diagnoses and all orders for this visit:  Migraine without aura and without status migrainosus, not intractable Well controlled on current regimen.  -     rizatriptan (MAXALT) 10 MG tablet; Take 1 tablet (10 mg total) by mouth as needed for migraine. May repeat in 2 hours if needed  Primary insomnia Increase to 100 mg nightly.  -     traZODone (DESYREL) 100 MG tablet; Take 1 tablet (100 mg total) by mouth at bedtime.  Class 2 obesity due to excess calories with body mass index (BMI) of 36.0 to 36.9 in adult, unspecified whether serious comorbidity present No weight loss despite adequate exercise and diet changes. Will try phentermine as below. PDMP reviewed no red flags.  -     phentermine (ADIPEX-P) 37.5 MG tablet; Take 1 tablet (37.5 mg total) by mouth daily before breakfast.  Elevated lipoprotein(a) Continue diet and exercise. The 10-year ASCVD risk score (Arnett DK, et al., 2019) is: 0.9%   Values used to calculate the score:     Age: 64 years     Sex: Female     Is Non-Hispanic African American: No     Diabetic: No     Tobacco smoker: No     Systolic Blood Pressure: 010 mmHg  Is BP treated: No     HDL  Cholesterol: 52 mg/dL     Total Cholesterol: 222 mg/dL   Follow-up: Return in about 4 weeks (around 11/28/2020) for weight .  The patient indicates understanding of these issues and agrees with the plan.    Chelsea Perking, FNP

## 2020-10-31 NOTE — Patient Instructions (Signed)
Phentermine Capsules or Tablets What is this medication? PHENTERMINE (FEN ter meen) promotes weight loss. It works by decreasing appetite. It is often used for a short period of time. Changes to diet and exercise are often combined with this medication. This medicine may be used for other purposes; ask your health care provider or pharmacist if you have questions. COMMON BRAND NAME(S): Adipex-P, Atti-Plex P, Atti-Plex P Spansule, Fastin, Lomaira, Pro-Fast, Tara-8 What should I tell my care team before I take this medication? They need to know if you have any of these conditions: Agitation or nervousness Diabetes Glaucoma Heart disease High blood pressure History of drug abuse or addiction History of stroke Kidney disease Lung disease called Primary Pulmonary Hypertension (PPH) Taken an MAOI like Carbex, Eldepryl, Marplan, Nardil, or Parnate in last 14 days Taking stimulant medications for attention disorders, weight loss, or to stay awake Thyroid disease An unusual or allergic reaction to phentermine, other medications, foods, dyes, or preservatives Pregnant or trying to get pregnant Breast-feeding How should I use this medication? Take this medication by mouth with a glass of water. Follow the directions on the prescription label. Take your medication at regular intervals. Do not take it more often than directed. Do not stop taking except on your care team's advice. Talk to your care team about the use of this medication in children. While this medication may be prescribed for children 17 years or older for selected conditions, precautions do apply. Overdosage: If you think you have taken too much of this medicine contact a poison control center or emergency room at once. NOTE: This medicine is only for you. Do not share this medicine with others. What if I miss a dose? If you miss a dose, take it as soon as you can. If it is almost time for your next dose, take only that dose. Do not  take double or extra doses. What may interact with this medication? Do not take this medication with any of the following: MAOIs like Carbex, Eldepryl, Marplan, Nardil, and Parnate This medication may also interact with the following: Alcohol Certain medications for depression, anxiety, or psychotic disorders Certain medications for high blood pressure Linezolid Medications for colds or breathing difficulties like pseudoephedrine or phenylephrine Medications for diabetes Sibutramine Stimulant medications for attention disorders, weight loss, or to stay awake This list may not describe all possible interactions. Give your health care provider a list of all the medicines, herbs, non-prescription drugs, or dietary supplements you use. Also tell them if you smoke, drink alcohol, or use illegal drugs. Some items may interact with your medicine. What should I watch for while using this medication? Visit your care team for regular checks on your progress. Do not stop taking except on your care team's advice. You may develop a severe reaction. Your care team will tell you how much medication to take. Do not take this medication close to bedtime. It may prevent you from sleeping. You may get drowsy or dizzy. Do not drive, use machinery, or do anything that needs mental alertness until you know how this medication affects you. Do not stand or sit up quickly, especially if you are an older patient. This reduces the risk of dizzy or fainting spells. Alcohol may increase dizziness and drowsiness. Avoid alcoholic drinks. This medication may affect blood sugar levels. Ask your care team if changes in diet or medications are needed if you have diabetes. Women should inform their care team if they wish to become pregnant or think they   might be pregnant. Losing weight while pregnant is not advised and may cause harm to the unborn child. Talk to your care team for more information. What side effects may I notice  from receiving this medication? Side effects that you should report to your care team as soon as possible: Allergic reactions-skin rash, itching, hives, swelling of the face, lips, tongue, or throat Heart failure-shortness of breath, swelling of the ankles, feet, or hands, sudden weight gain, unusual weakness or fatigue Pulmonary hypertension-shortness of breath, chest pain, fast or irregular heartbeat, feeling faint or lightheaded, fatigue, swelling of the ankles or feet Side effects that usually do not require medical attention (report to your care team if they continue or are bothersome): Change in taste Diarrhea Dizziness Dry mouth Restlessness Trouble sleeping This list may not describe all possible side effects. Call your doctor for medical advice about side effects. You may report side effects to FDA at 1-800-FDA-1088. Where should I keep my medication? Keep out of the reach of children. This medication can be abused. Keep your medication in a safe place to protect it from theft. Do not share this medication with anyone. Selling or giving away this medication is dangerous and against the law. This medication may cause harm and death if it is taken by other adults, children, or pets. Return medication that has not been used to an official disposal site. Contact the DEA at 1-800-882-9539 or your city/county government to find a site. If you cannot return the medication, mix any unused medication with a substance like cat litter or coffee grounds. Then throw the medication away in a sealed container like a sealed bag or coffee can with a lid. Do not use the medication after the expiration date. Store at room temperature between 20 and 25 degrees C (68 and 77 degrees F). Keep container tightly closed. NOTE: This sheet is a summary. It may not cover all possible information. If you have questions about this medicine, talk to your doctor, pharmacist, or health care provider.  2022 [employer]vier/[employer]  Standard (2020-04-06 20:04:50)  

## 2020-11-03 ENCOUNTER — Telehealth: Payer: Self-pay | Admitting: *Deleted

## 2020-11-03 NOTE — Telephone Encounter (Signed)
Prior Auth for Phentermine 37.5-APPROVED  Key: [id number]   [place] Case ID: [id number]   CaseId:[id number];Status:Approved;Review Type:Prior Auth;Coverage Start Date:10/04/2020;Coverage End Date:02/01/2021;

## 2020-12-01 ENCOUNTER — Other Ambulatory Visit: Payer: Self-pay

## 2020-12-01 ENCOUNTER — Ambulatory Visit (INDEPENDENT_AMBULATORY_CARE_PROVIDER_SITE_OTHER): Payer: 59 | Admitting: Family Medicine

## 2020-12-01 ENCOUNTER — Encounter: Payer: Self-pay | Admitting: Family Medicine

## 2020-12-01 VITALS — BP 123/80 | HR 77 | Temp 97.0°F | Resp 20 | Ht 65.0 in | Wt 216.0 lb

## 2020-12-01 DIAGNOSIS — Z6835 Body mass index (BMI) 35.0-35.9, adult: Secondary | ICD-10-CM

## 2020-12-01 MED ORDER — PHENTERMINE HCL 37.5 MG PO TABS: 37.5000 mg | ORAL_TABLET | Freq: Every day | ORAL | 0 refills | Status: DC

## 2020-12-01 NOTE — Patient Instructions (Signed)
Obesity, Adult ?Obesity is the condition of having too much total body fat. Being overweight or obese means that your weight is greater than what is considered healthy for your body size. Obesity is determined by a measurement called BMI (body mass index). BMI is an estimate of body fat and is calculated from height and weight. For adults, a BMI of 30 or higher is considered obese. ?Obesity can lead to other health concerns and major illnesses, including: ?Stroke. ?Coronary artery disease (CAD). ?Type 2 diabetes. ?Some types of cancer, including cancers of the colon, breast, uterus, and gallbladder. ?High blood pressure (hypertension). ?High cholesterol. ?Gallbladder stones. ?Obesity can also contribute to: ?Osteoarthritis. ?Sleep apnea. ?Infertility problems. ?What are the causes? ?Common causes of this condition include: ?Eating daily meals that are high in calories, sugar, and fat. ?Drinking high amounts of sugar-sweetened beverages, such as soft drinks. ?Being born with genes that may make you more likely to become obese. ?Having a medical condition that causes obesity, including: ?Hypothyroidism. ?Polycystic ovarian syndrome (PCOS). ?Binge-eating disorder. ?Cushing syndrome. ?Taking certain medicines, such as steroids, antidepressants, and seizure medicines. ?Not being physically active (sedentary lifestyle). ?Not getting enough sleep. ?What increases the risk? ?The following factors may make you more likely to develop this condition: ?Having a family history of obesity. ?Living in an area with limited access to: ?Parks, recreation centers, or sidewalks. ?Healthy food choices, such as grocery stores and farmers' markets. ?What are the signs or symptoms? ?The main sign of this condition is having too much body fat. ?How is this diagnosed? ?This condition is diagnosed based on: ?Your BMI. If you are an adult with a BMI of 30 or higher, you are considered obese. ?Your waist circumference. This measures the  distance around your waistline. ?Your skinfold thickness. Your health care provider may gently pinch a fold of your skin and measure it. ?You may have other tests to check for underlying conditions. ?How is this treated? ?Treatment for this condition often includes changing your lifestyle. Treatment may include some or all of the following: ?Dietary changes. This may include developing a healthy meal plan. ?Regular physical activity. This may include activity that causes your heart to beat faster (aerobic exercise) and strength training. Work with your health care provider to design an exercise program that works for you. ?Medicine to help you lose weight if you are unable to lose one pound a week after six weeks of healthy eating and more physical activity. ?Treating conditions that cause the obesity (underlying conditions). ?Surgery. Surgical options may include gastric banding and gastric bypass. Surgery may be done if: ?Other treatments have not helped to improve your condition. ?You have a BMI of 40 or higher. ?You have life-threatening health problems related to obesity. ?Follow these instructions at home: ?Eating and drinking ? ?Follow recommendations from your health care provider about what you eat and drink. Your health care provider may advise you to: ?Limit fast food, sweets, and processed snack foods. ?Choose low-fat options, such as low-fat milk instead of whole milk. ?Eat five or more servings of fruits or vegetables every day. ?Choose healthy foods when you eat out. ?Keep low-fat snacks available. ?Limit sugary drinks, such as soda, fruit juice, sweetened iced tea, and flavored milk. ?Drink enough water to keep your urine pale yellow. ?Do not follow a fad diet. Fad diets can be unhealthy and even dangerous. ?Other healthful choices include: ?Eat at home more often. This gives you more control over what you eat. ?Learn to read food labels.   This will help you understand how much food is considered one  serving. ?Learn what a healthy serving size is. ?Physical activity ?Exercise regularly, as told by your health care provider. ?Most adults should get up to 150 minutes of moderate-intensity exercise every week. ?Ask your health care provider what types of exercise are safe for you and how often you should exercise. ?Warm up and stretch before being active. ?Cool down and stretch after being active. ?Rest between periods of activity. ?Lifestyle ?Work with your health care provider and a dietitian to set a weight-loss goal that is healthy and reasonable for you. ?Limit your screen time. ?Find ways to reward yourself that do not involve food. ?Do not drink alcohol if: ?Your health care provider tells you not to drink. ?You are pregnant, may be pregnant, or are planning to become pregnant. ?If you drink alcohol: ?Limit how much you have to: ?0-1 drink a day for women. ?0-2 drinks a day for men. ?Know how much alcohol is in your drink. In the [place] one drink equals one 12 oz bottle of beer (355 mL), one 5 oz glass of wine (148 mL), or one 1? oz glass of hard liquor (44 mL). ?General instructions ?Keep a weight-loss journal to keep track of the food you eat and how much exercise you get. ?Take over-the-counter and prescription medicines only as told by your health care provider. ?Take vitamins and supplements only as told by your health care provider. ?Consider joining a support group. Your health care provider may be able to recommend a support group. ?Pay attention to your mental health as obesity can lead to depression or self esteem issues. ?Keep all follow-up visits. This is important. ?Contact a health care provider if: ?You are unable to meet your weight-loss goal after six weeks of dietary and lifestyle changes. ?You have trouble breathing. ?Summary ?Obesity is the condition of having too much total body fat. ?Being overweight or obese means that your weight is greater than what is considered healthy for your body  size. ?Work with your health care provider and a dietitian to set a weight-loss goal that is healthy and reasonable for you. ?Exercise regularly, as told by your health care provider. Ask your health care provider what types of exercise are safe for you and how often you should exercise. ?This information is not intended to replace advice given to you by your health care provider. Make sure you discuss any questions you have with your health care provider. ?Document Revised: 08/05/2020 Document Reviewed: 08/05/2020 ?Elsevier Patient Education ? 2022 [employer] ? ?

## 2020-12-01 NOTE — Progress Notes (Signed)
Established Patient Office Visit  Subjective:  Patient ID: Chelsea Snyder, female    DOB: [birth year]  Age: 40 y.o. MRN: [id number]  CC:  Chief Complaint  Patient presents with   Weight management 4 week    HPI Chelsea Snyder presents for weight management. She has been taking phentermine for the last 4 weeks. She reports doing well on this but does report a dry mouth. She does maintain a well balanced diet. She continues hiking 2-3x a week for 1.5-3 miles,  she plans to increase the frequency for this. She has lost 10 pounds since her last visit.   Past Medical History:  Diagnosis Date   Hyperlipidemia    Migraine     Past Surgical History:  Procedure Laterality Date   CESAREAN SECTION      Family History  Problem Relation Age of Onset   Cancer Mother        lung   Stroke Father     Social History   Socioeconomic History   Marital status: Married    Spouse name: Not on file   Number of children: Not on file   Years of education: Not on file   Highest education level: Not on file  Occupational History   Not on file  Tobacco Use   Smoking status: Never   Smokeless tobacco: Never  Vaping Use   Vaping Use: Never used  Substance and Sexual Activity   Alcohol use: No   Drug use: No   Sexual activity: Not on file  Other Topics Concern   Not on file  Social History Narrative   Not on file   Social Determinants of Health   Financial Resource Strain: Not on file  Food Insecurity: Not on file  Transportation Needs: Not on file  Physical Activity: Not on file  Stress: Not on file  Social Connections: Not on file  Intimate Partner Violence: Not on file    Outpatient Medications Prior to Visit  Medication Sig Dispense Refill   phentermine (ADIPEX-P) 37.5 MG tablet Take 1 tablet (37.5 mg total) by mouth daily before breakfast. 30 tablet 0   rizatriptan (MAXALT) 10 MG tablet Take 1 tablet (10 mg total) by mouth as needed for migraine. May repeat in 2 hours  if needed 10 tablet 3   traZODone (DESYREL) 100 MG tablet Take 1 tablet (100 mg total) by mouth at bedtime. 90 tablet 3   No facility-administered medications prior to visit.    Allergies  Allergen Reactions   Albuterol Itching   Keflex [Cephalexin] Rash    ROS Review of Systems As per HPI.   Objective:    Physical Exam Vitals and nursing note reviewed.  Constitutional:      General: She is not in acute distress.    Appearance: She is not ill-appearing, toxic-appearing or diaphoretic.  Cardiovascular:     Rate and Rhythm: Normal rate and regular rhythm.     Heart sounds: Normal heart sounds. No murmur heard. Pulmonary:     Effort: Pulmonary effort is normal. No respiratory distress.     Breath sounds: Normal breath sounds.  Musculoskeletal:     Right lower leg: No edema.     Left lower leg: No edema.  Skin:    General: Skin is warm and dry.  Neurological:     General: No focal deficit present.     Mental Status: She is alert and oriented to person, place, and time.  Psychiatric:  Mood and Affect: Mood normal.    BP 123/80   Pulse 77   Temp (!) 97 F (36.1 C) (Temporal)   Resp 20   Ht '5\' 5"'  (1.651 m)   Wt 216 lb (98 kg)   SpO2 99%   BMI 35.94 kg/m  Wt Readings from Last 3 Encounters:  12/01/20 216 lb (98 kg)  10/31/20 226 lb 6 oz (102.7 kg)  05/01/20 222 lb 8 oz (100.9 kg)     Health Maintenance Due  Topic Date Due   PAP SMEAR-Modifier  Never done    There are no preventive care reminders to display for this patient.  Lab Results  Component Value Date   TSH 1.290 03/25/2020   Lab Results  Component Value Date   WBC 8.0 03/25/2020   HGB 11.7 03/25/2020   HCT 37.0 03/25/2020   MCV 81 03/25/2020   PLT 247 03/25/2020   Lab Results  Component Value Date   NA 141 03/25/2020   K 4.2 03/25/2020   CO2 24 03/25/2020   GLUCOSE 85 03/25/2020   BUN 5 (L) 03/25/2020   CREATININE 0.68 03/25/2020   BILITOT 0.3 03/25/2020   ALKPHOS 39 (L)  03/25/2020   AST 14 03/25/2020   ALT 9 03/25/2020   PROT 6.7 03/25/2020   ALBUMIN 4.5 03/25/2020   CALCIUM 9.5 03/25/2020   EGFR 112 03/25/2020   Lab Results  Component Value Date   CHOL 222 (H) 03/25/2020   Lab Results  Component Value Date   HDL 52 03/25/2020   Lab Results  Component Value Date   LDLCALC 145 (H) 03/25/2020   Lab Results  Component Value Date   TRIG 141 03/25/2020   Lab Results  Component Value Date   CHOLHDL 4.3 03/25/2020   No results found for: HGBA1C    Assessment & Plan:   Esther was seen today for weight management 4 week.  Diagnoses and all orders for this visit:  Class 2 severe obesity due to excess calories with serious comorbidity and body mass index (BMI) of 35.0 to 35.9 in adult (HCC) BMI 35 with HLD. Doing well on phentermine with diet and exercise. Lost 10 lbs in last 4 weeks. PDMP reviewed, no red flags. Refill provided today. Follow up in 4 weeks.  -     phentermine (ADIPEX-P) 37.5 MG tablet; Take 1 tablet (37.5 mg total) by mouth daily before breakfast.   Follow-up: Return in about 4 weeks (around 12/29/2020) for weight management.   The patient indicates understanding of these issues and agrees with the plan.   Gwenlyn Perking, FNP

## 2020-12-29 ENCOUNTER — Encounter: Payer: Self-pay | Admitting: Family Medicine

## 2020-12-29 ENCOUNTER — Ambulatory Visit (INDEPENDENT_AMBULATORY_CARE_PROVIDER_SITE_OTHER): Payer: 59 | Admitting: Family Medicine

## 2020-12-29 VITALS — BP 128/69 | HR 75 | Temp 97.7°F | Ht 65.0 in | Wt 211.0 lb

## 2020-12-29 DIAGNOSIS — Z6835 Body mass index (BMI) 35.0-35.9, adult: Secondary | ICD-10-CM

## 2020-12-29 MED ORDER — PHENTERMINE HCL 37.5 MG PO TABS: 37.5000 mg | ORAL_TABLET | Freq: Every day | ORAL | 0 refills | Status: DC

## 2020-12-29 NOTE — Patient Instructions (Signed)

## 2020-12-29 NOTE — Progress Notes (Signed)
Established Patient Office Visit  Subjective:  Patient ID: Chelsea Snyder, female    DOB: April 28, 1980  Age: 40 y.o. MRN: [id number]  CC:  Chief Complaint  Patient presents with   Obesity    HPI Chelsea Snyder presents for weight management.   She has lost 5 lbs since her last visit. She reports doing well on phentermine. She continues to have a dry mouth but can tolerate this. She continues to do a well balanced diet. She did have 3 christmas parties this past weekend and did splurge some. She continues to exercise 3x a week with walking or hiking.   Past Medical History:  Diagnosis Date   Hyperlipidemia    Migraine     Past Surgical History:  Procedure Laterality Date   CESAREAN SECTION      Family History  Problem Relation Age of Onset   Cancer Mother        lung   Stroke Father     Social History   Socioeconomic History   Marital status: Married    Spouse name: Not on file   Number of children: Not on file   Years of education: Not on file   Highest education level: Not on file  Occupational History   Not on file  Tobacco Use   Smoking status: Never   Smokeless tobacco: Never  Vaping Use   Vaping Use: Never used  Substance and Sexual Activity   Alcohol use: No   Drug use: No   Sexual activity: Not on file  Other Topics Concern   Not on file  Social History Narrative   Not on file   Social Determinants of Health   Financial Resource Strain: Not on file  Food Insecurity: Not on file  Transportation Needs: Not on file  Physical Activity: Not on file  Stress: Not on file  Social Connections: Not on file  Intimate Partner Violence: Not on file    Outpatient Medications Prior to Visit  Medication Sig Dispense Refill   phentermine (ADIPEX-P) 37.5 MG tablet Take 1 tablet (37.5 mg total) by mouth daily before breakfast. 30 tablet 0   rizatriptan (MAXALT) 10 MG tablet Take 1 tablet (10 mg total) by mouth as needed for migraine. May repeat in 2  hours if needed 10 tablet 3   traZODone (DESYREL) 100 MG tablet Take 1 tablet (100 mg total) by mouth at bedtime. 90 tablet 3   No facility-administered medications prior to visit.    Allergies  Allergen Reactions   Albuterol Itching   Keflex [Cephalexin] Rash    ROS Review of Systems As per HPI.    Objective:    Physical Exam Vitals and nursing note reviewed.  Constitutional:      General: She is not in acute distress.    Appearance: She is not ill-appearing, toxic-appearing or diaphoretic.  Cardiovascular:     Rate and Rhythm: Normal rate and regular rhythm.     Heart sounds: Normal heart sounds. No murmur heard. Pulmonary:     Effort: Pulmonary effort is normal. No respiratory distress.     Breath sounds: Normal breath sounds.  Musculoskeletal:     Right lower leg: No edema.     Left lower leg: No edema.  Skin:    General: Skin is warm and dry.  Neurological:     General: No focal deficit present.     Mental Status: She is alert and oriented to person, place, and time.  Psychiatric:  Mood and Affect: Mood normal.    BP 128/69    Pulse 75    Temp 97.7 F (36.5 C) (Temporal)    Ht '5\' 5"'  (1.651 m)    Wt 211 lb (95.7 kg)    BMI 35.11 kg/m  Wt Readings from Last 3 Encounters:  12/29/20 211 lb (95.7 kg)  12/01/20 216 lb (98 kg)  10/31/20 226 lb 6 oz (102.7 kg)     Health Maintenance Due  Topic Date Due   PAP SMEAR-Modifier  Never done    There are no preventive care reminders to display for this patient.  Lab Results  Component Value Date   TSH 1.290 03/25/2020   Lab Results  Component Value Date   WBC 8.0 03/25/2020   HGB 11.7 03/25/2020   HCT 37.0 03/25/2020   MCV 81 03/25/2020   PLT 247 03/25/2020   Lab Results  Component Value Date   NA 141 03/25/2020   K 4.2 03/25/2020   CO2 24 03/25/2020   GLUCOSE 85 03/25/2020   BUN 5 (L) 03/25/2020   CREATININE 0.68 03/25/2020   BILITOT 0.3 03/25/2020   ALKPHOS 39 (L) 03/25/2020   AST 14  03/25/2020   ALT 9 03/25/2020   PROT 6.7 03/25/2020   ALBUMIN 4.5 03/25/2020   CALCIUM 9.5 03/25/2020   EGFR 112 03/25/2020   Lab Results  Component Value Date   CHOL 222 (H) 03/25/2020   Lab Results  Component Value Date   HDL 52 03/25/2020   Lab Results  Component Value Date   LDLCALC 145 (H) 03/25/2020   Lab Results  Component Value Date   TRIG 141 03/25/2020   Lab Results  Component Value Date   CHOLHDL 4.3 03/25/2020   No results found for: HGBA1C    Assessment & Plan:   Chelsea Snyder was seen today for obesity.  Diagnoses and all orders for this visit:  Class 2 severe obesity due to excess calories with serious comorbidity and body mass index (BMI) of 35.0 to 35.9 in adult [facility]) Down another 5 lbs. Has lost total of 15 lbs over 8 weeks. PDMP reviewed, no red flags. Refill provided. Follow up in 4 weeks.  -     phentermine (ADIPEX-P) 37.5 MG tablet; Take 1 tablet (37.5 mg total) by mouth daily before breakfast.  Follow-up: Return in about 4 weeks (around 01/26/2021) for weight management.   The patient indicates understanding of these issues and agrees with the plan.  Gwenlyn Perking, FNP

## 2021-01-28 ENCOUNTER — Encounter: Payer: Self-pay | Admitting: Nurse Practitioner

## 2021-01-28 ENCOUNTER — Ambulatory Visit (INDEPENDENT_AMBULATORY_CARE_PROVIDER_SITE_OTHER): Payer: 59 | Admitting: Nurse Practitioner

## 2021-01-28 VITALS — BP 119/72 | HR 77 | Temp 97.9°F | Ht 65.0 in | Wt 206.2 lb

## 2021-01-28 DIAGNOSIS — Z6834 Body mass index (BMI) 34.0-34.9, adult: Secondary | ICD-10-CM | POA: Diagnosis not present

## 2021-01-28 DIAGNOSIS — Z6835 Body mass index (BMI) 35.0-35.9, adult: Secondary | ICD-10-CM

## 2021-01-28 MED ORDER — PHENTERMINE HCL 37.5 MG PO TABS: 37.5000 mg | ORAL_TABLET | Freq: Every day | ORAL | 0 refills | Status: DC

## 2021-01-28 NOTE — Progress Notes (Signed)
Acute Office Visit  Subjective:    Patient ID: Chelsea Snyder, female    DOB: 04-18-1980, 41 y.o.   MRN: [id number]  Chief Complaint  Patient presents with   Obesity    HPI Patient is in today for patient is following up for obesity management. Patient is progressively making progress and has lost 4 lb in the last past 4 weeks. No new concerns, she continues to maximize weight loss medication with diet and exercise. Patient is compliant with medication and follows up as directed. Current medication : Phentermine 37.5 mg tablet by moth daily before breakfast.    Past Medical History:  Diagnosis Date   Hyperlipidemia    Migraine     Past Surgical History:  Procedure Laterality Date   CESAREAN SECTION      Family History  Problem Relation Age of Onset   Cancer Mother        lung   Stroke Father     Social History   Socioeconomic History   Marital status: Married    Spouse name: Not on file   Number of children: Not on file   Years of education: Not on file   Highest education level: Not on file  Occupational History   Not on file  Tobacco Use   Smoking status: Never   Smokeless tobacco: Never  Vaping Use   Vaping Use: Never used  Substance and Sexual Activity   Alcohol use: No   Drug use: No   Sexual activity: Not on file  Other Topics Concern   Not on file  Social History Narrative   Not on file   Social Determinants of Health   Financial Resource Strain: Not on file  Food Insecurity: Not on file  Transportation Needs: Not on file  Physical Activity: Not on file  Stress: Not on file  Social Connections: Not on file  Intimate Partner Violence: Not on file    Outpatient Medications Prior to Visit  Medication Sig Dispense Refill   rizatriptan (MAXALT) 10 MG tablet Take 1 tablet (10 mg total) by mouth as needed for migraine. May repeat in 2 hours if needed 10 tablet 3   traZODone (DESYREL) 100 MG tablet Take 1 tablet (100 mg total) by mouth at  bedtime. 90 tablet 3   phentermine (ADIPEX-P) 37.5 MG tablet Take 1 tablet (37.5 mg total) by mouth daily before breakfast. 30 tablet 0   No facility-administered medications prior to visit.    Allergies  Allergen Reactions   Albuterol Itching   Keflex [Cephalexin] Rash    Review of Systems  Constitutional: Negative.   HENT: Negative.    Eyes: Negative.   Respiratory: Negative.    Cardiovascular: Negative.   Gastrointestinal: Negative.   Genitourinary: Negative.   Musculoskeletal: Negative.   All other systems reviewed and are negative.     Objective:    Physical Exam Vitals and nursing note reviewed.  Constitutional:      Appearance: Normal appearance. She is obese.  HENT:     Right Ear: External ear normal.     Left Ear: External ear normal.     Nose: Nose normal.     Mouth/Throat:     Mouth: Mucous membranes are moist.     Pharynx: Oropharynx is clear.  Eyes:     Conjunctiva/sclera: Conjunctivae normal.  Cardiovascular:     Rate and Rhythm: Normal rate and regular rhythm.     Pulses: Normal pulses.     Heart  sounds: Normal heart sounds.  Pulmonary:     Effort: Pulmonary effort is normal.     Breath sounds: Normal breath sounds.  Abdominal:     General: Bowel sounds are normal.  Musculoskeletal:        General: Normal range of motion.  Skin:    General: Skin is warm.     Findings: No rash.  Neurological:     Mental Status: She is alert and oriented to person, place, and time.    BP 119/72    Pulse 77    Temp 97.9 F (36.6 C) (Temporal)    Ht 5' 5" (1.651 m)    Wt 206 lb 4 oz (93.6 kg)    BMI 34.32 kg/m  Wt Readings from Last 3 Encounters:  01/28/21 206 lb 4 oz (93.6 kg)  12/29/20 211 lb (95.7 kg)  12/01/20 216 lb (98 kg)    Health Maintenance Due  Topic Date Due   PAP SMEAR-Modifier  Never done    There are no preventive care reminders to display for this patient.   Lab Results  Component Value Date   TSH 1.290 03/25/2020   Lab Results   Component Value Date   WBC 8.0 03/25/2020   HGB 11.7 03/25/2020   HCT 37.0 03/25/2020   MCV 81 03/25/2020   PLT 247 03/25/2020   Lab Results  Component Value Date   NA 141 03/25/2020   K 4.2 03/25/2020   CO2 24 03/25/2020   GLUCOSE 85 03/25/2020   BUN 5 (L) 03/25/2020   CREATININE 0.68 03/25/2020   BILITOT 0.3 03/25/2020   ALKPHOS 39 (L) 03/25/2020   AST 14 03/25/2020   ALT 9 03/25/2020   PROT 6.7 03/25/2020   ALBUMIN 4.5 03/25/2020   CALCIUM 9.5 03/25/2020   EGFR 112 03/25/2020   Lab Results  Component Value Date   CHOL 222 (H) 03/25/2020   Lab Results  Component Value Date   HDL 52 03/25/2020   Lab Results  Component Value Date   LDLCALC 145 (H) 03/25/2020   Lab Results  Component Value Date   TRIG 141 03/25/2020   Lab Results  Component Value Date   CHOLHDL 4.3 03/25/2020   No results found for: HGBA1C     Assessment & Plan:   Problem List Items Addressed This Visit       Other   BMI 34.0-34.9,adult    Weight loss well managed with current medication , no changes to medication dose, RX refill sent to pharmacy        Other Visit Diagnoses     Class 2 severe obesity due to excess calories with serious comorbidity and body mass index (BMI) of 35.0 to 35.9 in adult [facility])    -  Primary   Relevant Medications   phentermine (ADIPEX-P) 37.5 MG tablet        Meds ordered this encounter  Medications   phentermine (ADIPEX-P) 37.5 MG tablet    Sig: Take 1 tablet (37.5 mg total) by mouth daily before breakfast.    Dispense:  30 tablet    Refill:  0    Order Specific Question:   Supervising Provider    AnswerClaretta Fraise [[id number]]     Ivy Lynn, NP

## 2021-01-28 NOTE — Patient Instructions (Signed)

## 2021-01-28 NOTE — Assessment & Plan Note (Signed)
Weight loss well managed with current medication , no changes to medication dose, RX refill sent to pharmacy

## 2021-02-27 ENCOUNTER — Ambulatory Visit (INDEPENDENT_AMBULATORY_CARE_PROVIDER_SITE_OTHER): Payer: 59 | Admitting: Nurse Practitioner

## 2021-02-27 ENCOUNTER — Encounter: Payer: Self-pay | Admitting: Nurse Practitioner

## 2021-02-27 DIAGNOSIS — Z6834 Body mass index (BMI) 34.0-34.9, adult: Secondary | ICD-10-CM | POA: Diagnosis not present

## 2021-02-27 DIAGNOSIS — Z6835 Body mass index (BMI) 35.0-35.9, adult: Secondary | ICD-10-CM

## 2021-02-27 MED ORDER — PHENTERMINE HCL 37.5 MG PO TABS: 37.5000 mg | ORAL_TABLET | Freq: Every day | ORAL | 0 refills | Status: DC

## 2021-02-27 NOTE — Progress Notes (Signed)
Established Patient Office Visit  Subjective:  Patient ID: Chelsea Snyder, female    DOB: 1980/12/24  Age: 41 y.o. MRN: [id number]  CC:  Chief Complaint  Patient presents with   Weight Loss    HPI Chelsea Snyder presents for patient is a 41 year old female who presents to clinic for follow-up weight loss.  She is currently on Atarax 37.5 mg tablet by mouth daily.  Patient is consistently losing an average of 3 to 4 pounds every 4 weeks. Patient is not reporting any side effects from current medication.  Patient is compliant with treatment and follows up as directed.  Past Medical History:  Diagnosis Date   Hyperlipidemia    Migraine     Past Surgical History:  Procedure Laterality Date   CESAREAN SECTION      Family History  Problem Relation Age of Onset   Cancer Mother        lung   Stroke Father     Social History   Socioeconomic History   Marital status: Married    Spouse name: Not on file   Number of children: Not on file   Years of education: Not on file   Highest education level: Not on file  Occupational History   Not on file  Tobacco Use   Smoking status: Never   Smokeless tobacco: Never  Vaping Use   Vaping Use: Never used  Substance and Sexual Activity   Alcohol use: No   Drug use: No   Sexual activity: Not on file  Other Topics Concern   Not on file  Social History Narrative   Not on file   Social Determinants of Health   Financial Resource Strain: Not on file  Food Insecurity: Not on file  Transportation Needs: Not on file  Physical Activity: Not on file  Stress: Not on file  Social Connections: Not on file  Intimate Partner Violence: Not on file    Outpatient Medications Prior to Visit  Medication Sig Dispense Refill   rizatriptan (MAXALT) 10 MG tablet Take 1 tablet (10 mg total) by mouth as needed for migraine. May repeat in 2 hours if needed 10 tablet 3   traZODone (DESYREL) 100 MG tablet Take 1 tablet (100 mg total) by mouth  at bedtime. 90 tablet 3   phentermine (ADIPEX-P) 37.5 MG tablet Take 1 tablet (37.5 mg total) by mouth daily before breakfast. 30 tablet 0   No facility-administered medications prior to visit.    Allergies  Allergen Reactions   Albuterol Itching   Keflex [Cephalexin] Rash    ROS Review of Systems  Constitutional: Negative.   HENT: Negative.    Eyes: Negative.   Respiratory: Negative.    Cardiovascular: Negative.   Gastrointestinal: Negative.   Genitourinary: Negative.   Musculoskeletal: Negative.   All other systems reviewed and are negative.    Objective:    Physical Exam Vitals and nursing note reviewed.  Constitutional:      Appearance: Normal appearance. She is normal weight.  HENT:     Head: Normocephalic.     Right Ear: Ear canal and external ear normal.     Left Ear: Ear canal and external ear normal.     Nose: Nose normal.     Mouth/Throat:     Mouth: Mucous membranes are moist.     Pharynx: Oropharynx is clear.  Eyes:     Conjunctiva/sclera: Conjunctivae normal.  Cardiovascular:     Rate and Rhythm: Normal rate and  regular rhythm.  Pulmonary:     Effort: Pulmonary effort is normal.     Breath sounds: Normal breath sounds.  Abdominal:     General: Bowel sounds are normal.  Musculoskeletal:        General: Normal range of motion.  Skin:    General: Skin is warm.     Findings: No rash.  Neurological:     Mental Status: She is alert and oriented to person, place, and time.  Psychiatric:        Mood and Affect: Mood normal.        Behavior: Behavior normal.    BP 122/78    Pulse 72    Temp 98.7 F (37.1 C)    Ht '5\' 5"'  (1.651 m)    Wt 203 lb (92.1 kg)    LMP 02/27/2021 (Exact Date)    SpO2 100%    BMI 33.78 kg/m  Wt Readings from Last 3 Encounters:  02/27/21 203 lb (92.1 kg)  01/28/21 206 lb 4 oz (93.6 kg)  12/29/20 211 lb (95.7 kg)     Health Maintenance Due  Topic Date Due   PAP SMEAR-Modifier  Never done    There are no preventive care  reminders to display for this patient.  Lab Results  Component Value Date   TSH 1.290 03/25/2020   Lab Results  Component Value Date   WBC 8.0 03/25/2020   HGB 11.7 03/25/2020   HCT 37.0 03/25/2020   MCV 81 03/25/2020   PLT 247 03/25/2020   Lab Results  Component Value Date   NA 141 03/25/2020   K 4.2 03/25/2020   CO2 24 03/25/2020   GLUCOSE 85 03/25/2020   BUN 5 (L) 03/25/2020   CREATININE 0.68 03/25/2020   BILITOT 0.3 03/25/2020   ALKPHOS 39 (L) 03/25/2020   AST 14 03/25/2020   ALT 9 03/25/2020   PROT 6.7 03/25/2020   ALBUMIN 4.5 03/25/2020   CALCIUM 9.5 03/25/2020   EGFR 112 03/25/2020   Lab Results  Component Value Date   CHOL 222 (H) 03/25/2020   Lab Results  Component Value Date   HDL 52 03/25/2020   Lab Results  Component Value Date   LDLCALC 145 (H) 03/25/2020   Lab Results  Component Value Date   TRIG 141 03/25/2020   Lab Results  Component Value Date   CHOLHDL 4.3 03/25/2020   No results found for: HGBA1C    Assessment & Plan:   Problem List Items Addressed This Visit       Other   BMI 34.0-34.9,adult    Refilled phentermine 37.5 mg tablet by mouth daily sent to pharmacy.  Return in 4 weeks.      Other Visit Diagnoses     Class 2 severe obesity due to excess calories with serious comorbidity and body mass index (BMI) of 35.0 to 35.9 in adult [facility])       Relevant Medications   phentermine (ADIPEX-P) 37.5 MG tablet       Meds ordered this encounter  Medications   phentermine (ADIPEX-P) 37.5 MG tablet    Sig: Take 1 tablet (37.5 mg total) by mouth daily before breakfast.    Dispense:  30 tablet    Refill:  0    Order Specific Question:   Supervising Provider    Answer:   Claretta Fraise [phone]    Follow-up: Return in about 4 weeks (around 03/27/2021) for weight loss.    Ivy Lynn, NP

## 2021-02-27 NOTE — Assessment & Plan Note (Addendum)
Refilled phentermine 37.5 mg tablet by mouth daily sent to pharmacy.  Return in 4 weeks.

## 2021-02-27 NOTE — Patient Instructions (Signed)

## 2021-03-31 ENCOUNTER — Ambulatory Visit (INDEPENDENT_AMBULATORY_CARE_PROVIDER_SITE_OTHER): Payer: 59 | Admitting: Nurse Practitioner

## 2021-03-31 ENCOUNTER — Encounter: Payer: Self-pay | Admitting: Nurse Practitioner

## 2021-03-31 VITALS — BP 121/77 | HR 72 | Temp 98.7°F | Ht 65.0 in | Wt 200.0 lb

## 2021-03-31 DIAGNOSIS — Z6835 Body mass index (BMI) 35.0-35.9, adult: Secondary | ICD-10-CM | POA: Diagnosis not present

## 2021-03-31 DIAGNOSIS — Z6834 Body mass index (BMI) 34.0-34.9, adult: Secondary | ICD-10-CM

## 2021-03-31 MED ORDER — PHENTERMINE HCL 37.5 MG PO TABS: 37.5000 mg | ORAL_TABLET | Freq: Every day | ORAL | 0 refills | Status: DC

## 2021-03-31 NOTE — Patient Instructions (Addendum)
Schedule follow up appointment in 4 weeks with PCP ? ? ?Calorie Counting for Weight Loss ?Calories are units of energy. Your body needs a certain number of calories from food to keep going throughout the day. When you eat or drink more calories than your body needs, your body stores the extra calories mostly as fat. When you eat or drink fewer calories than your body needs, your body burns fat to get the energy it needs. ?Calorie counting means keeping track of how many calories you eat and drink each day. Calorie counting can be helpful if you need to lose weight. If you eat fewer calories than your body needs, you should lose weight. Ask your health care provider what a healthy weight is for you. ?For calorie counting to work, you will need to eat the right number of calories each day to lose a healthy amount of weight per week. A dietitian can help you figure out how many calories you need in a day and will suggest ways to reach your calorie goal. ?A healthy amount of weight to lose each week is usually 1-2 lb (0.5-0.9 kg). This usually means that your daily calorie intake should be reduced by 500-750 calories. ?Eating 1,200-1,500 calories a day can help most women lose weight. ?Eating 1,500-1,800 calories a day can help most men lose weight. ?What do I need to know about calorie counting? ?Work with your health care provider or dietitian to determine how many calories you should get each day. To meet your daily calorie goal, you will need to: ?Find out how many calories are in each food that you would like to eat. Try to do this before you eat. ?Decide how much of the food you plan to eat. ?Keep a food log. Do this by writing down what you ate and how many calories it had. ?To successfully lose weight, it is important to balance calorie counting with a healthy lifestyle that includes regular activity. ?Where do I find calorie information? ?The number of calories in a food can be found on a Nutrition Facts label.  If a food does not have a Nutrition Facts label, try to look up the calories online or ask your dietitian for help. ?Remember that calories are listed per serving. If you choose to have more than one serving of a food, you will have to multiply the calories per serving by the number of servings you plan to eat. For example, the label on a package of bread might say that a serving size is 1 slice and that there are 90 calories in a serving. If you eat 1 slice, you will have eaten 90 calories. If you eat 2 slices, you will have eaten 180 calories. ?How do I keep a food log? ?After each time that you eat, record the following in your food log as soon as possible: ?What you ate. Be sure to include toppings, sauces, and other extras on the food. ?How much you ate. This can be measured in cups, ounces, or number of items. ?How many calories were in each food and drink. ?The total number of calories in the food you ate. ?Keep your food log near you, such as in a pocket-sized notebook or on an app or website on your mobile phone. Some programs will calculate calories for you and show you how many calories you have left to meet your daily goal. ?What are some portion-control tips? ?Know how many calories are in a serving. This will help you  know how many servings you can have of a certain food. ?Use a measuring cup to measure serving sizes. You could also try weighing out portions on a kitchen scale. With time, you will be able to estimate serving sizes for some foods. ?Take time to put servings of different foods on your favorite plates or in your favorite bowls and cups so you know what a serving looks like. ?Try not to eat straight from a food's packaging, such as from a bag or box. Eating straight from the package makes it hard to see how much you are eating and can lead to overeating. Put the amount you would like to eat in a cup or on a plate to make sure you are eating the right portion. ?Use smaller plates, glasses,  and bowls for smaller portions and to prevent overeating. ?Try not to multitask. For example, avoid watching TV or using your computer while eating. If it is time to eat, sit down at a table and enjoy your food. This will help you recognize when you are full. It will also help you be more mindful of what and how much you are eating. ?What are tips for following this plan? ?Reading food labels ?Check the calorie count compared with the serving size. The serving size may be smaller than what you are used to eating. ?Check the source of the calories. Try to choose foods that are high in protein, fiber, and vitamins, and low in saturated fat, trans fat, and sodium. ?Shopping ?Read nutrition labels while you shop. This will help you make healthy decisions about which foods to buy. ?Pay attention to nutrition labels for low-fat or fat-free foods. These foods sometimes have the same number of calories or more calories than the full-fat versions. They also often have added sugar, starch, or salt to make up for flavor that was removed with the fat. ?Make a grocery list of lower-calorie foods and stick to it. ?Cooking ?Try to cook your favorite foods in a healthier way. For example, try baking instead of frying. ?Use low-fat dairy products. ?Meal planning ?Use more fruits and vegetables. One-half of your plate should be fruits and vegetables. ?Include lean proteins, such as chicken, [place], and fish. ?Lifestyle ?Each week, aim to do one of the following: ?150 minutes of moderate exercise, such as walking. ?75 minutes of vigorous exercise, such as running. ?General information ?Know how many calories are in the foods you eat most often. This will help you calculate calorie counts faster. ?Find a way of tracking calories that works for you. Get creative. Try different apps or programs if writing down calories does not work for you. ?What foods should I eat? ? ?Eat nutritious foods. It is better to have a nutritious,  high-calorie food, such as an avocado, than a food with few nutrients, such as a bag of potato chips. ?Use your calories on foods and drinks that will fill you up and will not leave you hungry soon after eating. ?Examples of foods that fill you up are nuts and nut butters, vegetables, lean proteins, and high-fiber foods such as whole grains. High-fiber foods are foods with more than 5 g of fiber per serving. ?Pay attention to calories in drinks. Low-calorie drinks include water and unsweetened drinks. ?The items listed above may not be a complete list of foods and beverages you can eat. Contact a dietitian for more information. ?What foods should I limit? ?Limit foods or drinks that are not good sources of vitamins, minerals,  or protein or that are high in unhealthy fats. These include: ?Candy. ?Other sweets. ?Sodas, specialty coffee drinks, alcohol, and juice. ?The items listed above may not be a complete list of foods and beverages you should avoid. Contact a dietitian for more information. ?How do I count calories when eating out? ?Pay attention to portions. Often, portions are much larger when eating out. Try these tips to keep portions smaller: ?Consider sharing a meal instead of getting your own. ?If you get your own meal, eat only half of it. Before you start eating, ask for a container and put half of your meal into it. ?When available, consider ordering smaller portions from the menu instead of full portions. ?Pay attention to your food and drink choices. Knowing the way food is cooked and what is included with the meal can help you eat fewer calories. ?If calories are listed on the menu, choose the lower-calorie options. ?Choose dishes that include vegetables, fruits, whole grains, low-fat dairy products, and lean proteins. ?Choose items that are boiled, broiled, grilled, or steamed. Avoid items that are buttered, battered, fried, or served with cream sauce. Items labeled as crispy are usually fried,  unless stated otherwise. ?Choose water, low-fat milk, unsweetened iced tea, or other drinks without added sugar. If you want an alcoholic beverage, choose a lower-calorie option, such as a glass of wine or light bee

## 2021-03-31 NOTE — Progress Notes (Signed)
? ?Acute Office Visit ? ?Subjective:  ? ? Patient ID: Chelsea Snyder, female    DOB: [birth year], 41 y.o.   MRN: [id number] ? ?Chief Complaint  ?Patient presents with  ? Weight Check  ? ? ?HPI ?Patient is in today for weight loss.  Patient is consistently losing an average of 3 pounds/month.  Patient is compliant with current medication and follows up as directed.  No adverse reaction or side effect from current medication vital signs within normal limits. ? ?Past Medical History:  ?Diagnosis Date  ? Hyperlipidemia   ? Migraine   ? ? ?Past Surgical History:  ?Procedure Laterality Date  ? CESAREAN SECTION    ? ? ?Family History  ?Problem Relation Age of Onset  ? Cancer Mother   ?     lung  ? Stroke Father   ? ? ?Social History  ? ?Socioeconomic History  ? Marital status: Married  ?  Spouse name: Not on file  ? Number of children: Not on file  ? Years of education: Not on file  ? Highest education level: Not on file  ?Occupational History  ? Not on file  ?Tobacco Use  ? Smoking status: Never  ? Smokeless tobacco: Never  ?Vaping Use  ? Vaping Use: Never used  ?Substance and Sexual Activity  ? Alcohol use: No  ? Drug use: No  ? Sexual activity: Not on file  ?Other Topics Concern  ? Not on file  ?Social History Narrative  ? Not on file  ? ?Social Determinants of Health  ? ?Financial Resource Strain: Not on file  ?Food Insecurity: Not on file  ?Transportation Needs: Not on file  ?Physical Activity: Not on file  ?Stress: Not on file  ?Social Connections: Not on file  ?Intimate Partner Violence: Not on file  ? ? ?Outpatient Medications Prior to Visit  ?Medication Sig Dispense Refill  ? rizatriptan (MAXALT) 10 MG tablet Take 1 tablet (10 mg total) by mouth as needed for migraine. May repeat in 2 hours if needed 10 tablet 3  ? traZODone (DESYREL) 100 MG tablet Take 1 tablet (100 mg total) by mouth at bedtime. 90 tablet 3  ? phentermine (ADIPEX-P) 37.5 MG tablet Take 1 tablet (37.5 mg total) by mouth daily before breakfast. 30  tablet 0  ? ?No facility-administered medications prior to visit.  ? ? ?Allergies  ?Allergen Reactions  ? Albuterol Itching  ? Keflex [Cephalexin] Rash  ? ? ?Review of Systems  ?Constitutional: Negative.   ?HENT: Negative.    ?Eyes: Negative.   ?Respiratory: Negative.    ?Gastrointestinal: Negative.   ?Genitourinary: Negative.   ?Musculoskeletal: Negative.   ?Skin: Negative.  Negative for rash.  ?Psychiatric/Behavioral: Negative.    ?All other systems reviewed and are negative. ? ?   ?Objective:  ?  ?Physical Exam ?Vitals and nursing note reviewed.  ?Constitutional:   ?   Appearance: Normal appearance. She is obese.  ?HENT:  ?   Head: Normocephalic.  ?   Right Ear: External ear normal.  ?   Left Ear: External ear normal.  ?   Mouth/Throat:  ?   Mouth: Mucous membranes are moist.  ?   Pharynx: Oropharynx is clear.  ?Eyes:  ?   Conjunctiva/sclera: Conjunctivae normal.  ?Cardiovascular:  ?   Rate and Rhythm: Normal rate and regular rhythm.  ?Pulmonary:  ?   Effort: Pulmonary effort is normal.  ?   Breath sounds: Normal breath sounds.  ?Abdominal:  ?   General:  Bowel sounds are normal.  ?Skin: ?   General: Skin is warm.  ?   Findings: No erythema or rash.  ?Neurological:  ?   General: No focal deficit present.  ?   Mental Status: She is alert and oriented to person, place, and time.  ?Psychiatric:     ?   Mood and Affect: Mood normal.     ?   Behavior: Behavior normal.  ? ? ?BP 121/77   Pulse 72   Temp 98.7 ?F (37.1 ?C)   Ht '5\' 5"'  (1.651 m)   Wt 200 lb (90.7 kg)   LMP 03/24/2021 (Approximate)   SpO2 100%   BMI 33.28 kg/m?  ?Wt Readings from Last 3 Encounters:  ?03/31/21 200 lb (90.7 kg)  ?02/27/21 203 lb (92.1 kg)  ?01/28/21 206 lb 4 oz (93.6 kg)  ? ? ?Health Maintenance Due  ?Topic Date Due  ? PAP SMEAR-Modifier  Never done  ? ? ?There are no preventive care reminders to display for this patient. ? ? ?Lab Results  ?Component Value Date  ? TSH 1.290 03/25/2020  ? ?Lab Results  ?Component Value Date  ? WBC 8.0  03/25/2020  ? HGB 11.7 03/25/2020  ? HCT 37.0 03/25/2020  ? MCV 81 03/25/2020  ? PLT 247 03/25/2020  ? ?Lab Results  ?Component Value Date  ? NA 141 03/25/2020  ? K 4.2 03/25/2020  ? CO2 24 03/25/2020  ? GLUCOSE 85 03/25/2020  ? BUN 5 (L) 03/25/2020  ? CREATININE 0.68 03/25/2020  ? BILITOT 0.3 03/25/2020  ? ALKPHOS 39 (L) 03/25/2020  ? AST 14 03/25/2020  ? ALT 9 03/25/2020  ? PROT 6.7 03/25/2020  ? ALBUMIN 4.5 03/25/2020  ? CALCIUM 9.5 03/25/2020  ? EGFR 112 03/25/2020  ? ?Lab Results  ?Component Value Date  ? CHOL 222 (H) 03/25/2020  ? ?Lab Results  ?Component Value Date  ? HDL 52 03/25/2020  ? ?Lab Results  ?Component Value Date  ? LDLCALC 145 (H) 03/25/2020  ? ?Lab Results  ?Component Value Date  ? TRIG 141 03/25/2020  ? ?Lab Results  ?Component Value Date  ? CHOLHDL 4.3 03/25/2020  ? ?No results found for: HGBA1C ? ?   ?Assessment & Plan:  ? ?Problem List Items Addressed This Visit   ? ?  ? Other  ? BMI 34.0-34.9,adult  ?  Patient continues to lose weight as expected averaging 4 pounds per month.  Completed assessment education provided to patient printed handouts given.  Follow-up in 4 weeks.  Rx refill sent to pharmacy. ?  ?  ? ?Other Visit Diagnoses   ? ? Class 2 severe obesity due to excess calories with serious comorbidity and body mass index (BMI) of 35.0 to 35.9 in adult [facility])    -  Primary  ? Relevant Medications  ? phentermine (ADIPEX-P) 37.5 MG tablet  ? ?  ? ? ? ?Meds ordered this encounter  ?Medications  ? phentermine (ADIPEX-P) 37.5 MG tablet  ?  Sig: Take 1 tablet (37.5 mg total) by mouth daily before breakfast.  ?  Dispense:  30 tablet  ?  Refill:  0  ?  Order Specific Question:   Supervising Provider  ?  AnswerClaretta Fraise [[id number]]  ? ? ? ?Ivy Lynn, NP ? ?

## 2021-03-31 NOTE — Assessment & Plan Note (Signed)
Patient continues to lose weight as expected averaging 4 pounds per month.  Completed assessment education provided to patient printed handouts given.  Follow-up in 4 weeks.  Rx refill sent to pharmacy. ?

## 2021-04-29 ENCOUNTER — Encounter: Payer: Self-pay | Admitting: Family Medicine

## 2021-04-29 ENCOUNTER — Ambulatory Visit (INDEPENDENT_AMBULATORY_CARE_PROVIDER_SITE_OTHER): Payer: 59 | Admitting: Family Medicine

## 2021-04-29 VITALS — BP 121/68 | HR 85 | Temp 98.1°F | Ht 65.0 in | Wt 201.2 lb

## 2021-04-29 DIAGNOSIS — Z6833 Body mass index (BMI) 33.0-33.9, adult: Secondary | ICD-10-CM | POA: Diagnosis not present

## 2021-04-29 DIAGNOSIS — E6609 Other obesity due to excess calories: Secondary | ICD-10-CM

## 2021-04-29 NOTE — Progress Notes (Signed)
? ?  Established Patient Office Visit ? ?Subjective   ?Patient ID: Chelsea Snyder, female    DOB: 09-20-1980  Age: 41 y.o. MRN: [id number] ? ?Chief Complaint  ?Patient presents with  ? Obesity  ? ? ?HPI ? ?She has been on phentermine for 6 months now. Overall she has lost 27 pounds. She has gained 1 lb over the last month however. She has joined an exercise class and is walking more outside now that the weather is nice. She continues to maintain a well balanced diet. She isn't sure why she gain a pound over the last month other than the fact that she has hit a plateau at this weight many times in the past.  ? ?ROS ?As per HPI.  ?  ?Objective:  ?  ? ?BP 121/68   Pulse 85   Temp 98.1 ?F (36.7 ?C) (Temporal)   Ht 5\' 5"  (1.651 m)   Wt 201 lb 4 oz (91.3 kg)   BMI 33.49 kg/m?  ? ? ?Physical Exam ?Vitals and nursing note reviewed.  ?Constitutional:   ?   General: She is not in acute distress. ?   Appearance: She is not ill-appearing, toxic-appearing or diaphoretic.  ?HENT:  ?   Head: Normocephalic and atraumatic.  ?Cardiovascular:  ?   Rate and Rhythm: Normal rate and regular rhythm.  ?   Pulses: Normal pulses.  ?   Heart sounds: Normal heart sounds. No murmur heard. ?Pulmonary:  ?   Effort: Pulmonary effort is normal. No respiratory distress.  ?   Breath sounds: Normal breath sounds.  ?Abdominal:  ?   General: Bowel sounds are normal.  ?   Palpations: Abdomen is soft.  ?Musculoskeletal:  ?   Right lower leg: No edema.  ?   Left lower leg: No edema.  ?Skin: ?   General: Skin is warm and dry.  ?Neurological:  ?   General: No focal deficit present.  ?   Mental Status: She is alert and oriented to person, place, and time.  ?Psychiatric:     ?   Mood and Affect: Mood normal.     ?   Behavior: Behavior normal.     ?   Thought Content: Thought content normal.     ?   Judgment: Judgment normal.  ? ? ? ?No results found for any visits on 04/29/21. ? ? ? ?The 10-year ASCVD risk score (Arnett DK, et al., 2019) is: 0.8% ? ?   ?Assessment & Plan:  ? ?Chelsea Snyder was seen today for obesity. ? ?Diagnoses and all orders for this visit: ? ?Class 1 obesity due to excess calories with serious comorbidity and body mass index (BMI) of 33.0 to 33.9 in adult ?Has been on phentermine x 6 months. Discussed will need to take a break from phentermine given length of time that she has been on this. Has lost 27 lb over with 1 lb gained over last month. Discussed saxenda and wegovy as options if covered by insurance. Patient will contact her insurance to get on this a notify me.  ? ?Return in about 4 weeks (around 05/27/2021) for cpe. ? ?The patient indicates understanding of these issues and agrees with the plan.  ? ? ?05/29/2021, FNP ? ?

## 2021-04-29 NOTE — Patient Instructions (Signed)
Here is a guide to help us find out which weight loss medications will be covered by your insurance plan.  Please check out this web site  [email or web address] and follow the 3 simple steps.   There is also a phone number you can call if you do not have access to the Internet. [phone] (Monday- Friday 8am-8pm)  [employer] provides coverage information for more than 80% of the inquiries submitted!!  

## 2021-04-30 ENCOUNTER — Telehealth: Payer: Self-pay | Admitting: *Deleted

## 2021-04-30 ENCOUNTER — Other Ambulatory Visit: Payer: Self-pay | Admitting: Family Medicine

## 2021-04-30 DIAGNOSIS — E6609 Other obesity due to excess calories: Secondary | ICD-10-CM

## 2021-04-30 MED ORDER — SAXENDA 18 MG/3ML ~~LOC~~ SOPN: PEN_INJECTOR | SUBCUTANEOUS | 3 refills | Status: DC

## 2021-04-30 MED ORDER — INSULIN PEN NEEDLE 32G X 6 MM MISC: 1.0000 | Freq: Every day | 3 refills | Status: DC

## 2021-04-30 NOTE — Telephone Encounter (Signed)
Saxenda Prior Authorization ? ?Case [id number] ? ?Status: Approved ? ?Coverage Start Date:03/31/2021 ?Coverage End Date:08/28/2021; ?

## 2021-04-30 NOTE — Telephone Encounter (Signed)
Left message advising saxenda approved and to call back with any further questions or concerns. ?

## 2021-05-28 ENCOUNTER — Encounter: Payer: Self-pay | Admitting: Family Medicine

## 2021-05-28 ENCOUNTER — Ambulatory Visit (INDEPENDENT_AMBULATORY_CARE_PROVIDER_SITE_OTHER): Payer: 59 | Admitting: Family Medicine

## 2021-05-28 VITALS — BP 101/64 | HR 69 | Temp 97.5°F | Ht 65.0 in | Wt 195.1 lb

## 2021-05-28 DIAGNOSIS — Z0001 Encounter for general adult medical examination with abnormal findings: Secondary | ICD-10-CM | POA: Diagnosis not present

## 2021-05-28 DIAGNOSIS — E7800 Pure hypercholesterolemia, unspecified: Secondary | ICD-10-CM

## 2021-05-28 DIAGNOSIS — Z6833 Body mass index (BMI) 33.0-33.9, adult: Secondary | ICD-10-CM

## 2021-05-28 DIAGNOSIS — E6609 Other obesity due to excess calories: Secondary | ICD-10-CM | POA: Diagnosis not present

## 2021-05-28 DIAGNOSIS — Z0000 Encounter for general adult medical examination without abnormal findings: Secondary | ICD-10-CM

## 2021-05-28 NOTE — Progress Notes (Signed)
Chelsea Snyder Snyder Chelsea Snyder a 41 y.o. female presents to office today for annual physical exam examination.    Concerns today include: 1. None doing well.   She has started on Saxenda. She Chelsea Snyder now on 2.4 mg and will increase to 3 mg tomorrow. She reports mild indigestion for the first few days following a dose changes but this then resolves. Denies other side effects. She has noticed a decreased in her appetite. She has lost 6 lbs in the last month since starting on it.   Occupation: Psychologist, forensic, Marital status: married, Substance use: denies Diet: well balance, Exercise: hiking, exercise class weekly Last eye exam: within last year Last dental exam: a few years Last mammogram: never Last pap smear: 2006, Chelsea Snyder planning to GYN   Past Medical History:  Diagnosis Date   Hyperlipidemia    Migraine    Social History   Socioeconomic History   Marital status: Married    Spouse name: Not on file   Number of children: Not on file   Years of education: Not on file   Highest education level: Not on file  Occupational History   Not on file  Tobacco Use   Smoking status: Never   Smokeless tobacco: Never  Vaping Use   Vaping Use: Never used  Substance and Sexual Activity   Alcohol use: No   Drug use: No   Sexual activity: Not on file  Other Topics Concern   Not on file  Social History Narrative   Not on file   Social Determinants of Health   Financial Resource Strain: Not on file  Food Insecurity: Not on file  Transportation Needs: Not on file  Physical Activity: Not on file  Stress: Not on file  Social Connections: Not on file  Intimate Partner Violence: Not on file   Past Surgical History:  Procedure Laterality Date   CESAREAN SECTION     Family History  Problem Relation Age of Onset   Cancer Mother        lung   Stroke Father     Current Outpatient Medications:    Insulin Pen Needle 32G X 6 MM MISC, 1 each by Does not apply route daily., Disp: 90 each, Rfl: 3    Liraglutide -Weight Management (SAXENDA) 18 MG/3ML SOPN, Inject 0.6 mg into the skin daily x 1 week. Then increase by 0.6 mg weekly to a target dose of 3 mg daily., Disp: 12 mL, Rfl: 3   rizatriptan (MAXALT) 10 MG tablet, Take 1 tablet (10 mg total) by mouth as needed for migraine. May repeat in 2 hours if needed, Disp: 10 tablet, Rfl: 3   traZODone (DESYREL) 100 MG tablet, Take 1 tablet (100 mg total) by mouth at bedtime., Disp: 90 tablet, Rfl: 3  Allergies  Allergen Reactions   Albuterol Itching   Keflex [Cephalexin] Rash     ROS: Review of Systems Pertinent items noted in HPI and remainder of comprehensive ROS otherwise negative.    Physical exam BP 101/64   Pulse 69   Temp (!) 97.5 F (36.4 C) (Temporal)   Ht 5' 5" (1.651 m)   Wt 195 lb 2 oz (88.5 kg)   SpO2 100%   BMI 32.47 kg/m  General appearance: alert, cooperative, and no distress Head: Normocephalic, without obvious abnormality, atraumatic Eyes: conjunctivae/corneas clear. PERRL, EOM's intact. Fundi benign. Ears: normal TM's and external ear canals both ears Nose: Nares normal. Septum midline. Mucosa normal. No drainage or sinus tenderness. Throat:  lips, mucosa, and tongue normal; teeth and gums normal Neck: no adenopathy, no carotid bruit, no JVD, supple, symmetrical, trachea midline, and thyroid not enlarged, symmetric, no tenderness/mass/nodules Back: symmetric, no curvature. ROM normal. No CVA tenderness. Lungs: clear to auscultation bilaterally Heart: regular rate and rhythm, S1, S2 normal, no murmur, click, rub or gallop Abdomen: soft, non-tender; bowel sounds normal; no masses,  no organomegaly Extremities: extremities normal, atraumatic, no cyanosis or edema Pulses: 2+ and symmetric Skin: Skin color, texture, turgor normal. No rashes or lesions Neurologic: Alert and oriented X 3, normal strength and tone. Normal symmetric reflexes. Normal coordination and gait    Assessment/ Plan: Chelsea Snyder Snyder here for  annual physical exam.   Chelsea Snyder Snyder was seen today for annual exam.  Diagnoses and all orders for this visit:  Routine general medical examination at a health care facility Fasting labs pending.  -     CBC with Differential/Platelet -     CMP14+EGFR -     Lipid panel -     Thyroid Panel With TSH  Class 1 obesity due to excess calories with serious comorbidity and body mass index (BMI) of 33.0 to 33.9 in adult Has started on saxenda. Doing well. Down 6 lbs in 1 months. Titrate up to 3 mg. Continue diet and exercise. Follow up in 6 weeks.  -     CBC with Differential/Platelet -     CMP14+EGFR -     Lipid panel -     Thyroid Panel With TSH  Pure hypercholesterolemia Diet, exercise, weight loss. Labs pending.  -     CBC with Differential/Platelet -     CMP14+EGFR -     Lipid panel   Counseled on healthy lifestyle choices, including diet (rich in fruits, vegetables and lean meats and low in salt and simple carbohydrates) and exercise (at least 30 minutes of moderate physical activity daily).  Patient to follow up in 1 year for annual exam or sooner if needed.  The above assessment and management plan was discussed with the patient. The patient verbalized understanding of and has agreed to the management plan. [name] Snyder aware to call the clinic if symptoms persist or worsen. [name] Snyder aware when to return to the clinic for a follow-up visit. Patient educated on when it Chelsea Snyder appropriate to go to the emergency department.   Chelsea Snyder Smolder, FNP-C [facility] [address] [place],  [address] [phone]

## 2021-05-28 NOTE — Patient Instructions (Signed)

## 2021-05-29 LAB — CBC WITH DIFFERENTIAL/PLATELET
Basophils Absolute: 0 10*3/uL (ref 0.0–0.2)
Basos: 0 %
EOS (ABSOLUTE): 0.1 10*3/uL (ref 0.0–0.4)
Eos: 1 %
Hematocrit: 33 % — ABNORMAL LOW (ref 34.0–46.6)
Hemoglobin: 10.1 g/dL — ABNORMAL LOW (ref 11.1–15.9)
Immature Grans (Abs): 0 10*3/uL (ref 0.0–0.1)
Immature Granulocytes: 0 %
Lymphocytes Absolute: 2.4 10*3/uL (ref 0.7–3.1)
Lymphs: 29 %
MCH: 22 pg — ABNORMAL LOW (ref 26.6–33.0)
MCHC: 30.6 g/dL — ABNORMAL LOW (ref 31.5–35.7)
MCV: 72 fL — ABNORMAL LOW (ref 79–97)
Monocytes Absolute: 0.6 10*3/uL (ref 0.1–0.9)
Monocytes: 7 %
Neutrophils Absolute: 5.1 10*3/uL (ref 1.4–7.0)
Neutrophils: 63 %
Platelets: 267 10*3/uL (ref 150–450)
RBC: 4.59 x10E6/uL (ref 3.77–5.28)
RDW: 15.8 % — ABNORMAL HIGH (ref 11.7–15.4)
WBC: 8.1 10*3/uL (ref 3.4–10.8)

## 2021-05-29 LAB — CMP14+EGFR
ALT: 7 IU/L (ref 0–32)
AST: 13 IU/L (ref 0–40)
Albumin/Globulin Ratio: 2 (ref 1.2–2.2)
Albumin: 4.5 g/dL (ref 3.8–4.8)
Alkaline Phosphatase: 37 IU/L — ABNORMAL LOW (ref 44–121)
BUN/Creatinine Ratio: 14 (ref 9–23)
BUN: 9 mg/dL (ref 6–24)
Bilirubin Total: 0.3 mg/dL (ref 0.0–1.2)
CO2: 23 mmol/L (ref 20–29)
Calcium: 9.8 mg/dL (ref 8.7–10.2)
Chloride: 102 mmol/L (ref 96–106)
Creatinine, Ser: 0.66 mg/dL (ref 0.57–1.00)
Globulin, Total: 2.2 g/dL (ref 1.5–4.5)
Glucose: 83 mg/dL (ref 70–99)
Potassium: 4.3 mmol/L (ref 3.5–5.2)
Sodium: 138 mmol/L (ref 134–144)
Total Protein: 6.7 g/dL (ref 6.0–8.5)
eGFR: 112 mL/min/{1.73_m2} (ref >59)

## 2021-05-29 LAB — LIPID PANEL
Chol/HDL Ratio: 3.5 ratio (ref 0.0–4.4)
Cholesterol, Total: 190 mg/dL (ref 100–199)
HDL: 54 mg/dL (ref >39)
LDL Chol Calc (NIH): 121 mg/dL — ABNORMAL HIGH (ref 0–99)
Triglycerides: 81 mg/dL (ref 0–149)
VLDL Cholesterol Cal: 15 mg/dL (ref 5–40)

## 2021-05-29 LAB — THYROID PANEL WITH TSH
Free Thyroxine Index: 2 (ref 1.2–4.9)
T3 Uptake Ratio: 26 % (ref 24–39)
T4, Total: 7.7 ug/dL (ref 4.5–12.0)
TSH: 0.937 u[IU]/mL (ref 0.450–4.500)

## 2021-07-09 ENCOUNTER — Ambulatory Visit (INDEPENDENT_AMBULATORY_CARE_PROVIDER_SITE_OTHER): Payer: 59 | Admitting: Family Medicine

## 2021-07-09 ENCOUNTER — Encounter: Payer: Self-pay | Admitting: Family Medicine

## 2021-07-09 VITALS — BP 102/62 | HR 68 | Temp 97.2°F | Ht 65.0 in | Wt 194.1 lb

## 2021-07-09 DIAGNOSIS — Z6832 Body mass index (BMI) 32.0-32.9, adult: Secondary | ICD-10-CM | POA: Diagnosis not present

## 2021-07-09 DIAGNOSIS — D509 Iron deficiency anemia, unspecified: Secondary | ICD-10-CM

## 2021-07-09 DIAGNOSIS — E6609 Other obesity due to excess calories: Secondary | ICD-10-CM

## 2021-07-09 DIAGNOSIS — E66811 Obesity, class 1: Secondary | ICD-10-CM

## 2021-07-09 NOTE — Progress Notes (Signed)
Established Patient Office Visit  Subjective   Patient ID: Chelsea Snyder, female    DOB: August 02, 1980  Age: 41 y.o. MRN: [id number]  Chief Complaint  Patient presents with   Obesity    Patient presents to the clinic for 6 week follow up with Saxenda. Patient states she is exercising and eating a low calorie diet. She has not been able to exercise as often as she normal does due to the weather. She reports doing with with the 3 mg dosage and denies significant side effects. States she has began taking iron supplements and probiotic daily for the last 4 weeks.    Past Medical History:  Diagnosis Date   Hyperlipidemia    Migraine       Review of Systems  Constitutional:  Positive for weight loss. Negative for chills and fever.  HENT:  Negative for congestion, hearing loss, nosebleeds and tinnitus.   Eyes:  Negative for blurred vision, double vision and pain.  Respiratory:  Negative for cough, hemoptysis, sputum production and shortness of breath.   Cardiovascular:  Negative for chest pain, palpitations and orthopnea.  Gastrointestinal:  Negative for abdominal pain, constipation, heartburn and nausea.  Genitourinary:  Negative for dysuria, flank pain and urgency.  Musculoskeletal:  Negative for joint pain and myalgias.  Skin:  Negative for itching and rash.  Neurological:  Negative for dizziness, tingling, tremors and headaches.  Endo/Heme/Allergies:  Negative for polydipsia. Does not bruise/bleed easily.  Psychiatric/Behavioral:  Negative for depression, substance abuse and suicidal ideas.       Objective:     BP 102/62   Pulse 68   Temp (!) 97.2 F (36.2 C) (Temporal)   Ht 5\' 5"  (1.651 m)   Wt 88.1 kg   SpO2 99%   BMI 32.30 kg/m    Physical Exam Constitutional:      Appearance: Normal appearance. She is obese.  HENT:     Head: Normocephalic and atraumatic.     Right Ear: Tympanic membrane, ear canal and external ear normal.     Left Ear: Tympanic membrane,  ear canal and external ear normal.     Nose: Nose normal.     Mouth/Throat:     Mouth: Mucous membranes are moist.  Eyes:     Extraocular Movements: Extraocular movements intact.  Cardiovascular:     Rate and Rhythm: Normal rate and regular rhythm.     Pulses: Normal pulses.     Heart sounds: Normal heart sounds.  Pulmonary:     Effort: Pulmonary effort is normal.     Breath sounds: Normal breath sounds.  Abdominal:     Palpations: Abdomen is soft.  Musculoskeletal:        General: Normal range of motion.     Cervical back: Normal range of motion and neck supple.  Skin:    General: Skin is warm and dry.     Capillary Refill: Capillary refill takes less than 2 seconds.  Neurological:     Mental Status: She is alert and oriented to person, place, and time.  Psychiatric:        Mood and Affect: Mood normal.        Behavior: Behavior normal.        Thought Content: Thought content normal.        Judgment: Judgment normal.      No results found for any visits on 07/09/21.    The 10-year ASCVD risk score (Arnett DK, et al., 2019) is: 0.4%  Assessment & Plan:   Chelsea Snyder was seen today for obesity.  Diagnoses and all orders for this visit:  Class 1 obesity due to excess calories with serious comorbidity and body mass index (BMI) of 32.0 to 32.9 in adult Down 1 pound over last week. Is down 6 lbs since starting saxenda. Discussed diet and exercise. Continue saxenda 3 mg daily. Follow up in 6 weeks to recheck.   Iron deficiency anemia, unspecified iron deficiency anemia type Continue daily iron supplement- has been on for 4 weeks now. Will recheck CBC at next visit.    Return in about 6 weeks (around 08/20/2021) for weight.    Chelsea Kos, RN, FNP student  I personally was present during the history, physical exam, and medical decision-making activities of this service and have verified that the service and findings are accurately documented in the nurse practitioner  student's note.  Harlow Mares, FNP

## 2021-08-04 ENCOUNTER — Telehealth: Payer: Self-pay

## 2021-08-04 NOTE — Telephone Encounter (Signed)
(  Key: [id number])  This request has been approved.  Please note any additional information provided by [employer] at the bottom of your screen.  [facility] in [place] made aware.

## 2021-08-07 ENCOUNTER — Other Ambulatory Visit: Payer: Self-pay | Admitting: Family Medicine

## 2021-08-07 DIAGNOSIS — E6609 Other obesity due to excess calories: Secondary | ICD-10-CM

## 2021-08-20 ENCOUNTER — Ambulatory Visit (INDEPENDENT_AMBULATORY_CARE_PROVIDER_SITE_OTHER): Payer: 59 | Admitting: Family Medicine

## 2021-08-20 ENCOUNTER — Encounter: Payer: Self-pay | Admitting: Family Medicine

## 2021-08-20 VITALS — BP 107/67 | HR 74 | Temp 97.4°F | Ht 65.0 in | Wt 196.0 lb

## 2021-08-20 DIAGNOSIS — Z6832 Body mass index (BMI) 32.0-32.9, adult: Secondary | ICD-10-CM

## 2021-08-20 DIAGNOSIS — D509 Iron deficiency anemia, unspecified: Secondary | ICD-10-CM

## 2021-08-20 DIAGNOSIS — F5101 Primary insomnia: Secondary | ICD-10-CM | POA: Diagnosis not present

## 2021-08-20 DIAGNOSIS — E6609 Other obesity due to excess calories: Secondary | ICD-10-CM

## 2021-08-20 MED ORDER — TRAZODONE HCL 100 MG PO TABS: 100.0000 mg | ORAL_TABLET | Freq: Every day | ORAL | 3 refills | Status: AC

## 2021-08-20 NOTE — Progress Notes (Signed)
 Established Patient Office Visit  Subjective   Patient ID: Chelsea Snyder, female    DOB: 12/01/1980  Age: 41 y.o. MRN: [id number]  Chief Complaint  Patient presents with   Obesity    HPI Chelsea Snyder reports doing well since her last visit. She is still eating a healthy diet over all. She continues to exercise 3x a week. Her weight has been stable but she has not lost any pounds since her last visit. She does feel like she looks like she has lost weight and her clothes are looser since her last visit. She has been taking saxenda 3 mg daily. Her insurance will cover this through November.   She is due to have her hemoglobin rechecked today. She has been taking a daily iron supplement.   She needs a refill on trazodone. She reports that this is working well and she is sleeping well.   Past Medical History:  Diagnosis Date   Hyperlipidemia    Migraine       Review of Systems  All other systems reviewed and are negative.     Objective:     BP 107/67   Pulse 74   Temp (!) 97.4 F (36.3 C) (Temporal)   Ht 5' 5" (1.651 m)   Wt 196 lb (88.9 kg)   SpO2 97%   BMI 32.62 kg/m  Wt Readings from Last 3 Encounters:  08/20/21 196 lb (88.9 kg)  07/09/21 194 lb 2 oz (88.1 kg)  05/28/21 195 lb 2 oz (88.5 kg)      Physical Exam Vitals and nursing note reviewed.  Constitutional:      General: She is not in acute distress.    Appearance: She is not ill-appearing, toxic-appearing or diaphoretic.  Cardiovascular:     Rate and Rhythm: Normal rate and regular rhythm.     Heart sounds: Normal heart sounds. No murmur heard. Pulmonary:     Effort: Pulmonary effort is normal. No respiratory distress.     Breath sounds: Normal breath sounds.  Musculoskeletal:     Right lower leg: No edema.     Left lower leg: No edema.  Skin:    General: Skin is warm and dry.  Neurological:     General: No focal deficit present.     Mental Status: She is alert and oriented to person, place, and time.   Psychiatric:        Mood and Affect: Mood normal.        Behavior: Behavior normal.    No results found for any visits on 08/20/21.  Last CBC Lab Results  Component Value Date   WBC 8.1 05/28/2021   HGB 10.1 (L) 05/28/2021   HCT 33.0 (L) 05/28/2021   MCV 72 (L) 05/28/2021   MCH 22.0 (L) 05/28/2021   RDW 15.8 (H) 05/28/2021   PLT 267 05/28/2021   Last metabolic panel Lab Results  Component Value Date   GLUCOSE 83 05/28/2021   NA 138 05/28/2021   K 4.3 05/28/2021   CL 102 05/28/2021   CO2 23 05/28/2021   BUN 9 05/28/2021   CREATININE 0.66 05/28/2021   EGFR 112 05/28/2021   CALCIUM 9.8 05/28/2021   PROT 6.7 05/28/2021   ALBUMIN 4.5 05/28/2021   LABGLOB 2.2 05/28/2021   AGRATIO 2.0 05/28/2021   BILITOT 0.3 05/28/2021   ALKPHOS 37 (L) 05/28/2021   AST 13 05/28/2021   ALT 7 05/28/2021   Last lipids Lab Results  Component Value Date   CHOL   190 05/28/2021   HDL 54 05/28/2021   LDLCALC 121 (H) 05/28/2021   TRIG 81 05/28/2021   CHOLHDL 3.5 05/28/2021   Last hemoglobin A1c No results found for: "HGBA1C" Last thyroid functions Lab Results  Component Value Date   TSH 0.937 05/28/2021   T4TOTAL 7.7 05/28/2021      The 10-year ASCVD risk score (Arnett DK, et al., 2019) is: 0.4%    Assessment & Plan:   Merial was seen today for obesity.  Diagnoses and all orders for this visit:  Class 1 obesity due to excess calories with serious comorbidity and body mass index (BMI) of 32.0 to 32.9 in adult Stable weight, but no weight loss since last visit. Patient reports that her clothes are feeling looser. Continue healthy diet and exercise. Will continue saxenda for now. Patient will check with insurance to see if Mancel Parsons would be covered. Declined nutrition referral today.   Iron deficiency anemia, unspecified iron deficiency anemia type On daily supplement. Will check labs as below today.  -     Anemia Profile B  Primary insomnia Well controlled on current regimen.   -     traZODone (DESYREL) 100 MG tablet; Take 1 tablet (100 mg total) by mouth at bedtime.  Return in about 8 weeks (around 10/15/2021) for weight .   The patient indicates understanding of these issues and agrees with the plan.  Gwenlyn Perking, FNP

## 2021-08-20 NOTE — Patient Instructions (Signed)
Here is a guide to help us find out which weight loss medications will be covered by your insurance plan.  Please check out this web site  [email or web address] and follow the 3 simple steps.   There is also a phone number you can call if you do not have access to the Internet. [phone] (Monday- Friday 8am-8pm)  [employer] provides coverage information for more than 80% of the inquiries submitted!!  

## 2021-08-21 ENCOUNTER — Other Ambulatory Visit: Payer: Self-pay | Admitting: Family Medicine

## 2021-08-21 DIAGNOSIS — D509 Iron deficiency anemia, unspecified: Secondary | ICD-10-CM

## 2021-08-21 LAB — ANEMIA PROFILE B
Basophils Absolute: 0 10*3/uL (ref 0.0–0.2)
Basos: 0 %
EOS (ABSOLUTE): 0.1 10*3/uL (ref 0.0–0.4)
Eos: 2 %
Ferritin: 7 ng/mL — ABNORMAL LOW (ref 15–150)
Folate: 8.9 ng/mL (ref >3.0)
Hematocrit: 30.1 % — ABNORMAL LOW (ref 34.0–46.6)
Hemoglobin: 9.2 g/dL — ABNORMAL LOW (ref 11.1–15.9)
Immature Grans (Abs): 0 10*3/uL (ref 0.0–0.1)
Immature Granulocytes: 0 %
Iron Saturation: 4 % — CL (ref 15–55)
Iron: 18 ug/dL — ABNORMAL LOW (ref 27–159)
Lymphocytes Absolute: 2.1 10*3/uL (ref 0.7–3.1)
Lymphs: 31 %
MCH: 22.3 pg — ABNORMAL LOW (ref 26.6–33.0)
MCHC: 30.6 g/dL — ABNORMAL LOW (ref 31.5–35.7)
MCV: 73 fL — ABNORMAL LOW (ref 79–97)
Monocytes Absolute: 0.4 10*3/uL (ref 0.1–0.9)
Monocytes: 7 %
Neutrophils Absolute: 4 10*3/uL (ref 1.4–7.0)
Neutrophils: 60 %
Platelets: 241 10*3/uL (ref 150–450)
RBC: 4.13 x10E6/uL (ref 3.77–5.28)
RDW: 16 % — ABNORMAL HIGH (ref 11.7–15.4)
Retic Ct Pct: 1.5 % (ref 0.6–2.6)
Total Iron Binding Capacity: 461 ug/dL — ABNORMAL HIGH (ref 250–450)
UIBC: 443 ug/dL — ABNORMAL HIGH (ref 131–425)
Vitamin B-12: 289 pg/mL (ref 232–1245)
WBC: 6.7 10*3/uL (ref 3.4–10.8)

## 2021-08-24 ENCOUNTER — Encounter: Payer: Self-pay | Admitting: Family Medicine

## 2021-08-24 ENCOUNTER — Telehealth: Payer: Self-pay | Admitting: Family Medicine

## 2021-08-24 NOTE — Telephone Encounter (Signed)
Patient would like to discuss labs from 8/10. Please call back and advise.

## 2021-08-24 NOTE — Telephone Encounter (Signed)
Patient aware of lab results and verbalizes understanding.  

## 2021-08-26 ENCOUNTER — Other Ambulatory Visit: Payer: Self-pay | Admitting: Family Medicine

## 2021-08-26 DIAGNOSIS — E6609 Other obesity due to excess calories: Secondary | ICD-10-CM

## 2021-08-26 MED ORDER — SEMAGLUTIDE-WEIGHT MANAGEMENT 2.4 MG/0.75ML ~~LOC~~ SOAJ: 2.4000 mg | SUBCUTANEOUS | 6 refills | Status: DC

## 2021-08-26 MED ORDER — SEMAGLUTIDE-WEIGHT MANAGEMENT 1.7 MG/0.75ML ~~LOC~~ SOAJ: 1.7000 mg | SUBCUTANEOUS | 1 refills | Status: DC

## 2021-09-11 ENCOUNTER — Inpatient Hospital Stay: Payer: 59 | Attending: Hematology | Admitting: Hematology

## 2021-09-11 VITALS — BP 124/82 | HR 91 | Temp 98.4°F | Resp 19 | Ht 65.0 in | Wt 198.0 lb

## 2021-09-11 DIAGNOSIS — D509 Iron deficiency anemia, unspecified: Secondary | ICD-10-CM | POA: Insufficient documentation

## 2021-09-11 NOTE — Patient Instructions (Signed)
[facility]  Discharge Instructions  You were seen and examined today by Dr. Ellin Saba. Dr. Ellin Saba is a hematologist, meaning that he specializes in blood abnormalities. Dr. Ellin Saba discussed your past medical history, family history of cancers/blood conditions and the events that led to you being here today.  You were referred to Dr. Ellin Saba due to anemia. You need IV iron. That can be given here in the cancer center.  Follow-up as scheduled.  Thank you for choosing [facility] to provide your oncology and hematology care.   To afford each patient quality time with our provider, please arrive at least 15 minutes before your scheduled appointment time. You may need to reschedule your appointment if you arrive late (10 or more minutes). Arriving late affects you and other patients whose appointments are after yours.  Also, if you miss three or more appointments without notifying the office, you may be dismissed from the clinic at the provider's discretion.    Again, thank you for choosing [facility].  Our hope is that these requests will decrease the amount of time that you wait before being seen by our physicians.   If you have a lab appointment with the Cancer Center please come in thru the Main Entrance and check in at the main information desk.           _____________________________________________________________  Should you have questions after your visit to [facility], please contact our office at [phone] and follow the prompts.  Our office hours are 8:00 a.m. to 4:30 p.m. Monday - Thursday and 8:00 a.m. to 2:30 p.m. Friday.  Please note that voicemails left after 4:00 p.m. may not be returned until the following business day.  We are closed weekends and all major holidays.  You do have access to a nurse 24-7, just call the main number to the clinic [phone] and do not press any  options, hold on the line and a nurse will answer the phone.    For prescription refill requests, have your pharmacy contact our office and allow 72 hours.    Masks are optional in the cancer centers. If you would like for your care team to wear a mask while they are taking care of you, please let them know. You may have one support person who is at least 43 years old accompany you for your appointments.

## 2021-09-11 NOTE — Progress Notes (Signed)
CONSULT NOTE  Patient Care Team: Gabriel Earing, FNP as PCP - General (Family Medicine) Doreatha Massed, MD as Medical Oncologist (Hematology)  CHIEF COMPLAINTS/PURPOSE OF CONSULTATION:  Severe iron deficiency anemia  HISTORY OF PRESENTING ILLNESS:  Chelsea Snyder 41 y.o. female is seen at the request of Harlow Mares, FNP for further work-up and management of severe iron deficiency anemia.  CBC on 08/20/2021 shows hemoglobin 9.2, MCV 73.  Ferritin was low at 7 and percent saturation 4.  B12 folic acid was normal.  She reports having heavy.  For the last couple of years.  She has been taking iron tablet (Slow Fe) with 45 mg of elemental iron daily.  No prior history of blood transfusion.  No prior history of parenteral iron therapy.  No ice pica.  No bleeding per rectum or melena.  She eats a variety of diet.  She works as a Print production planner at [employer].  No chemical exposure.  Non-smoker.  No family history of anemias.    MEDICAL HISTORY:  Past Medical History:  Diagnosis Date   Hyperlipidemia    Migraine     SURGICAL HISTORY: Past Surgical History:  Procedure Laterality Date   CESAREAN SECTION      SOCIAL HISTORY: Social History   Socioeconomic History   Marital status: Married    Spouse name: Not on file   Number of children: Not on file   Years of education: Not on file   Highest education level: Not on file  Occupational History   Not on file  Tobacco Use   Smoking status: Never   Smokeless tobacco: Never  Vaping Use   Vaping Use: Never used  Substance and Sexual Activity   Alcohol use: No   Drug use: No   Sexual activity: Not on file  Other Topics Concern   Not on file  Social History Narrative   Not on file   Social Determinants of Health   Financial Resource Strain: Not on file  Food Insecurity: Not on file  Transportation Needs: Not on file  Physical Activity: Not on file  Stress: Not on file  Social Connections: Not on file   Intimate Partner Violence: Not on file    FAMILY HISTORY: Family History  Problem Relation Age of Onset   Cancer Mother        lung   Stroke Father     ALLERGIES:  is allergic to albuterol and keflex [cephalexin].  MEDICATIONS:  Current Outpatient Medications  Medication Sig Dispense Refill   Insulin Pen Needle 32G X 6 MM MISC 1 each by Does not apply route daily. 90 each 3   Liraglutide -Weight Management (SAXENDA) 18 MG/3ML SOPN INJECT 0.6 MG INTO THE SKIN DAILY FOR 1 WEEK, THEN INCREASE BY 0.6 MG WEEKLY TO TARGET DOSE OF 3 MG 15 mL 0   rizatriptan (MAXALT) 10 MG tablet Take 1 tablet (10 mg total) by mouth as needed for migraine. May repeat in 2 hours if needed 10 tablet 3   traZODone (DESYREL) 100 MG tablet Take 1 tablet (100 mg total) by mouth at bedtime. 90 tablet 3   Semaglutide-Weight Management 1.7 MG/0.75ML SOAJ Inject 1.7 mg into the skin once a week. 3 mL 1   Semaglutide-Weight Management 2.4 MG/0.75ML SOAJ Inject 2.4 mg into the skin once a week. 3 mL 6   No current facility-administered medications for this visit.    REVIEW OF SYSTEMS:   Constitutional: Denies fevers, chills or abnormal night  sweats Eyes: Denies blurriness of vision, double vision or watery eyes Ears, nose, mouth, throat, and face: Denies mucositis or sore throat Respiratory: Denies cough, dyspnea or wheezes Cardiovascular: Denies palpitation, chest discomfort or lower extremity swelling Gastrointestinal: Positive for constipation and occasional nausea. Skin: Denies abnormal skin rashes Lymphatics: Denies new lymphadenopathy or easy bruising Neurological:Denies numbness, tingling or new weaknesses.  Positive for headaches. Behavioral/Psych: Mood is stable, no new changes  All other systems were reviewed with the patient and are negative.  PHYSICAL EXAMINATION: ECOG PERFORMANCE STATUS: 0 - Asymptomatic  Vitals:   09/11/21 0931  BP: 124/82  Pulse: 91  Resp: 19  Temp: 98.4 F (36.9 C)   SpO2: 99%   Filed Weights   09/11/21 0931  Weight: 198 lb (89.8 kg)    GENERAL:alert, no distress and comfortable SKIN: skin color, texture, turgor are normal, no rashes or significant lesions EYES: normal, conjunctiva are pink and non-injected, sclera clear OROPHARYNX:no exudate, no erythema and lips, buccal mucosa, and tongue normal  NECK: supple, thyroid normal size, non-tender, without nodularity LYMPH:  no palpable lymphadenopathy in the cervical, axillary or inguinal LUNGS: clear to auscultation and percussion with normal breathing effort HEART: regular rate & rhythm and no murmurs and no lower extremity edema ABDOMEN:abdomen soft, non-tender and normal bowel sounds Musculoskeletal:no cyanosis of digits and no clubbing  PSYCH: alert & oriented x 3 with fluent speech NEURO: no focal motor/sensory deficits  LABORATORY DATA:  I have reviewed the data as listed Recent Results (from the past 2160 hour(s))  Anemia Profile B     Status: Abnormal   Collection Time: 08/20/21  4:36 PM  Result Value Ref Range   Total Iron Binding Capacity 461 (H) 250 - 450 ug/dL   UIBC 440 (H) 347 - 425 ug/dL   Iron 18 (L) 27 - 956 ug/dL   Iron Saturation 4 (LL) 15 - 55 %   Ferritin 7 (L) 15 - 150 ng/mL   Vitamin B-12 289 232 - 1,245 pg/mL   Folate 8.9 >3.0 ng/mL    Comment: A serum folate concentration of less than 3.1 ng/mL is considered to represent clinical deficiency.    WBC 6.7 3.4 - 10.8 x10E3/uL   RBC 4.13 3.77 - 5.28 x10E6/uL   Hemoglobin 9.2 (L) 11.1 - 15.9 g/dL   Hematocrit 38.7 (L) 56.4 - 46.6 %   MCV 73 (L) 79 - 97 fL   MCH 22.3 (L) 26.6 - 33.0 pg   MCHC 30.6 (L) 31.5 - 35.7 g/dL   RDW 33.2 (H) 95.1 - 88.4 %   Platelets 241 150 - 450 x10E3/uL   Neutrophils 60 Not Estab. %   Lymphs 31 Not Estab. %   Monocytes 7 Not Estab. %   Eos 2 Not Estab. %   Basos 0 Not Estab. %   Neutrophils Absolute 4.0 1.4 - 7.0 x10E3/uL   Lymphocytes Absolute 2.1 0.7 - 3.1 x10E3/uL   Monocytes  Absolute 0.4 0.1 - 0.9 x10E3/uL   EOS (ABSOLUTE) 0.1 0.0 - 0.4 x10E3/uL   Basophils Absolute 0.0 0.0 - 0.2 x10E3/uL   Immature Granulocytes 0 Not Estab. %   Immature Grans (Abs) 0.0 0.0 - 0.1 x10E3/uL   Retic Ct Pct 1.5 0.6 - 2.6 %    RADIOGRAPHIC STUDIES: I have personally reviewed the radiological images as listed and agreed with the findings in the report. No results found.  ASSESSMENT :  1.  Severe iron deficiency anemia: - 08/20/2021: Hb-9.2, MCV-73, ferritin-7, percent saturation-4 -  On iron pill (Slow Fe) 45 mg elemental iron daily since May 2023. - No prior history of blood transfusion or iron infusion.  No ice pica.  No bleeding per rectum or melena. - She has heavy.  For the last 2 years, 6 to 7 days of bleeding every 21-24 days.  5 days of heavy bleeding.  2.  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  Non-smoker.  No chemical exposure. - No family history of anemia requiring transfusions. - Mother had lung cancer.  Maternal grandmother had lung cancer.  Both were smokers.   PLAN:  1.  Severe iron deficiency anemia: - She did not have any major improvement in hemoglobin since she started oral iron therapy.  Her hemoglobin actually dropped by one-point since May. - Hence have recommended parenteral iron therapy with either Venofer or Feraheme for a total dose of 1 g. - Recommend RTC 8 weeks for follow-up with repeat labs.  If there is no improvement, will consider checking for other nutritional deficiencies and other causes.   All questions were answered. The patient knows to call the clinic with any problems, questions or concerns.      Doreatha Massed, MD 09/11/21 1:57 PM

## 2021-09-15 ENCOUNTER — Inpatient Hospital Stay: Payer: 59

## 2021-09-15 VITALS — BP 118/75 | HR 68 | Temp 98.3°F | Resp 18

## 2021-09-15 DIAGNOSIS — D509 Iron deficiency anemia, unspecified: Secondary | ICD-10-CM | POA: Diagnosis not present

## 2021-09-15 DIAGNOSIS — D500 Iron deficiency anemia secondary to blood loss (chronic): Secondary | ICD-10-CM

## 2021-09-15 MED ORDER — SODIUM CHLORIDE 0.9 % IV SOLN
300.0000 mg | Freq: Once | INTRAVENOUS | Status: AC
  Administered 2021-09-15: 300 mg via INTRAVENOUS
  Filled 2021-09-15: qty 300

## 2021-09-15 MED ORDER — SODIUM CHLORIDE 0.9 % IV SOLN: Freq: Once | INTRAVENOUS | Status: AC

## 2021-09-15 MED ORDER — ACETAMINOPHEN 325 MG PO TABS
650.0000 mg | ORAL_TABLET | Freq: Once | ORAL | Status: AC
  Administered 2021-09-15: 650 mg via ORAL
  Filled 2021-09-15: qty 2

## 2021-09-15 MED ORDER — LORATADINE 10 MG PO TABS
10.0000 mg | ORAL_TABLET | Freq: Once | ORAL | Status: AC
  Administered 2021-09-15: 10 mg via ORAL
  Filled 2021-09-15: qty 1

## 2021-09-28 ENCOUNTER — Inpatient Hospital Stay: Payer: 59

## 2021-09-28 VITALS — BP 110/69 | HR 81 | Temp 98.7°F | Resp 18

## 2021-09-28 DIAGNOSIS — D509 Iron deficiency anemia, unspecified: Secondary | ICD-10-CM | POA: Diagnosis not present

## 2021-09-28 DIAGNOSIS — D500 Iron deficiency anemia secondary to blood loss (chronic): Secondary | ICD-10-CM

## 2021-09-28 MED ORDER — ACETAMINOPHEN 325 MG PO TABS
650.0000 mg | ORAL_TABLET | Freq: Once | ORAL | Status: AC
  Administered 2021-09-28: 650 mg via ORAL
  Filled 2021-09-28: qty 2

## 2021-09-28 MED ORDER — LORATADINE 10 MG PO TABS
10.0000 mg | ORAL_TABLET | Freq: Once | ORAL | Status: AC
  Administered 2021-09-28: 10 mg via ORAL
  Filled 2021-09-28: qty 1

## 2021-09-28 MED ORDER — SODIUM CHLORIDE 0.9 % IV SOLN
300.0000 mg | Freq: Once | INTRAVENOUS | Status: AC
  Administered 2021-09-28: 300 mg via INTRAVENOUS
  Filled 2021-09-28: qty 300

## 2021-09-28 MED ORDER — SODIUM CHLORIDE 0.9 % IV SOLN: Freq: Once | INTRAVENOUS | Status: AC

## 2021-09-28 NOTE — Progress Notes (Signed)
Iron infusion given per orders. Patient tolerated it well without problems. Vitals stable and discharged home from clinic ambulatory. Follow up as scheduled.  

## 2021-10-08 MED FILL — Iron Sucrose Inj 20 MG/ML (Fe Equiv): INTRAVENOUS | Qty: 20 | Status: AC

## 2021-10-09 ENCOUNTER — Inpatient Hospital Stay: Payer: 59

## 2021-10-09 VITALS — BP 114/59 | HR 77 | Temp 98.2°F | Resp 18

## 2021-10-09 DIAGNOSIS — D500 Iron deficiency anemia secondary to blood loss (chronic): Secondary | ICD-10-CM

## 2021-10-09 DIAGNOSIS — D509 Iron deficiency anemia, unspecified: Secondary | ICD-10-CM | POA: Diagnosis not present

## 2021-10-09 MED ORDER — SODIUM CHLORIDE 0.9 % IV SOLN: Freq: Once | INTRAVENOUS | Status: AC

## 2021-10-09 MED ORDER — LORATADINE 10 MG PO TABS
10.0000 mg | ORAL_TABLET | Freq: Once | ORAL | Status: AC
  Administered 2021-10-09: 10 mg via ORAL
  Filled 2021-10-09: qty 1

## 2021-10-09 MED ORDER — SODIUM CHLORIDE 0.9 % IV SOLN
400.0000 mg | Freq: Once | INTRAVENOUS | Status: AC
  Administered 2021-10-09: 400 mg via INTRAVENOUS
  Filled 2021-10-09: qty 20

## 2021-10-09 MED ORDER — ACETAMINOPHEN 325 MG PO TABS
650.0000 mg | ORAL_TABLET | Freq: Once | ORAL | Status: AC
  Administered 2021-10-09: 650 mg via ORAL
  Filled 2021-10-09: qty 2

## 2021-10-09 NOTE — Progress Notes (Signed)
Patient presents today for Venofer 400 mg iron infusion. Vital signs are stable. Patient denies any changes since the last iron infusion. Patient denies any complaints today. MAR reviewed and updated.    Venofer given today per MD orders. Tolerated infusion without adverse affects. Vital signs stable. No complaints at this time. Discharged from clinic ambulatory in stable condition. Alert and oriented x 3. F/U with [facility] as scheduled.

## 2021-10-09 NOTE — Patient Instructions (Signed)
[facility]  Discharge Instructions: Thank you for choosing [facility] to provide your oncology and hematology care.  If you have a lab appointment with the Cancer Center, please come in thru the Main Entrance and check in at the main information desk.  Wear comfortable clothing and clothing appropriate for easy access to any Portacath or PICC line.   We strive to give you quality time with your provider. You may need to reschedule your appointment if you arrive late (15 or more minutes).  Arriving late affects you and other patients whose appointments are after yours.  Also, if you miss three or more appointments without notifying the office, you may be dismissed from the clinic at the provider's discretion.      For prescription refill requests, have your pharmacy contact our office and allow 72 hours for refills to be completed.    Today you received the following chemotherapy and/or immunotherapy agents Venofer 400 mg.  Iron Sucrose Injection What is this medication? IRON SUCROSE (EYE ern SOO krose) treats low levels of iron (iron deficiency anemia) in people with kidney disease. Iron is a mineral that plays an important role in making red blood cells, which carry oxygen from your lungs to the rest of your body. This medicine may be used for other purposes; ask your health care provider or pharmacist if you have questions. COMMON BRAND NAME(S): Venofer What should I tell my care team before I take this medication? They need to know if you have any of these conditions: Anemia not caused by low iron levels Heart disease High levels of iron in the blood Kidney disease Liver disease An unusual or allergic reaction to iron, other medications, foods, dyes, or preservatives Pregnant or trying to get pregnant Breast-feeding How should I use this medication? This medication is for infusion into a vein. It is given in a hospital or clinic setting. Talk to  your care team about the use of this medication in children. While this medication may be prescribed for children as young as 2 years for selected conditions, precautions do apply. Overdosage: If you think you have taken too much of this medicine contact a poison control center or emergency room at once. NOTE: This medicine is only for you. Do not share this medicine with others. What if I miss a dose? It is important not to miss your dose. Call your care team if you are unable to keep an appointment. What may interact with this medication? Do not take this medication with any of the following: Deferoxamine Dimercaprol Other iron products This medication may also interact with the following: Chloramphenicol Deferasirox This list may not describe all possible interactions. Give your health care provider a list of all the medicines, herbs, non-prescription drugs, or dietary supplements you use. Also tell them if you smoke, drink alcohol, or use illegal drugs. Some items may interact with your medicine. What should I watch for while using this medication? Visit your care team regularly. Tell your care team if your symptoms do not start to get better or if they get worse. You may need blood work done while you are taking this medication. You may need to follow a special diet. Talk to your care team. Foods that contain iron include: whole grains/cereals, dried fruits, beans, or peas, leafy green vegetables, and organ meats (liver, kidney). What side effects may I notice from receiving this medication? Side effects that you should report to your care team as   soon as possible: Allergic reactions--skin rash, itching, hives, swelling of the face, lips, tongue, or throat Low blood pressure--dizziness, feeling faint or lightheaded, blurry vision Shortness of breath Side effects that usually do not require medical attention (report to your care team if they continue or are  bothersome): Flushing Headache Joint pain Muscle pain Nausea Pain, redness, or irritation at injection site This list may not describe all possible side effects. Call your doctor for medical advice about side effects. You may report side effects to FDA at 1-800-FDA-1088. Where should I keep my medication? This medication is given in a hospital or clinic and will not be stored at home. NOTE: This sheet is a summary. It may not cover all possible information. If you have questions about this medicine, talk to your doctor, pharmacist, or health care provider.  2023 Elsevier/Gold Standard (2007-02-18 00:00:00)       To help prevent nausea and vomiting after your treatment, we encourage you to take your nausea medication as directed.  BELOW ARE SYMPTOMS THAT SHOULD BE REPORTED IMMEDIATELY: *FEVER GREATER THAN 100.4 F (38 C) OR HIGHER *CHILLS OR SWEATING *NAUSEA AND VOMITING THAT IS NOT CONTROLLED WITH YOUR NAUSEA MEDICATION *UNUSUAL SHORTNESS OF BREATH *UNUSUAL BRUISING OR BLEEDING *URINARY PROBLEMS (pain or burning when urinating, or frequent urination) *BOWEL PROBLEMS (unusual diarrhea, constipation, pain near the anus) TENDERNESS IN MOUTH AND THROAT WITH OR WITHOUT PRESENCE OF ULCERS (sore throat, sores in mouth, or a toothache) UNUSUAL RASH, SWELLING OR PAIN  UNUSUAL VAGINAL DISCHARGE OR ITCHING   Items with * indicate a potential emergency and should be followed up as soon as possible or go to the Emergency Department if any problems should occur.  Please show the CHEMOTHERAPY ALERT CARD or IMMUNOTHERAPY ALERT CARD at check-in to the Emergency Department and triage nurse.  Should you have questions after your visit or need to cancel or reschedule your appointment, please contact [facility] [phone]  and follow the prompts.  Office hours are 8:00 a.m. to 4:30 p.m. Monday - Friday. Please note that voicemails left after 4:00 p.m. may not be returned until  the following business day.  We are closed weekends and major holidays. You have access to a nurse at all times for urgent questions. Please call the main number to the clinic [phone] and follow the prompts.  For any non-urgent questions, you may also contact your provider using MyChart. We now offer e-Visits for anyone 18 and older to request care online for non-urgent symptoms. For details visit [email or web address].   Also download the MyChart app! Go to the app store, search "MyChart", open the app, select [facility], and log in with your MyChart username and password.  Masks are optional in the cancer centers. If you would like for your care team to wear a mask while they are taking care of you, please let them know. You may have one support person who is at least 43 years old accompany you for your appointments.  

## 2021-10-15 ENCOUNTER — Ambulatory Visit: Payer: 59 | Admitting: Family Medicine

## 2021-10-26 ENCOUNTER — Encounter: Payer: Self-pay | Admitting: Family Medicine

## 2021-10-26 ENCOUNTER — Other Ambulatory Visit: Payer: Self-pay | Admitting: Family Medicine

## 2021-10-26 DIAGNOSIS — Z6832 Body mass index (BMI) 32.0-32.9, adult: Secondary | ICD-10-CM

## 2021-10-26 MED ORDER — SEMAGLUTIDE-WEIGHT MANAGEMENT 2.4 MG/0.75ML ~~LOC~~ SOAJ: 2.4000 mg | SUBCUTANEOUS | 6 refills | Status: DC

## 2021-10-26 MED ORDER — SEMAGLUTIDE-WEIGHT MANAGEMENT 0.25 MG/0.5ML ~~LOC~~ SOAJ: 0.2500 mg | SUBCUTANEOUS | 0 refills | Status: DC

## 2021-10-26 MED ORDER — SEMAGLUTIDE-WEIGHT MANAGEMENT 1 MG/0.5ML ~~LOC~~ SOAJ: 1.0000 mg | SUBCUTANEOUS | 0 refills | Status: DC

## 2021-10-26 MED ORDER — SEMAGLUTIDE-WEIGHT MANAGEMENT 0.5 MG/0.5ML ~~LOC~~ SOAJ: 0.5000 mg | SUBCUTANEOUS | 0 refills | Status: DC

## 2021-10-26 MED ORDER — SEMAGLUTIDE-WEIGHT MANAGEMENT 1.7 MG/0.75ML ~~LOC~~ SOAJ: 1.7000 mg | SUBCUTANEOUS | 1 refills | Status: DC

## 2021-10-30 ENCOUNTER — Other Ambulatory Visit: Payer: Self-pay

## 2021-10-30 DIAGNOSIS — D509 Iron deficiency anemia, unspecified: Secondary | ICD-10-CM

## 2021-11-02 ENCOUNTER — Inpatient Hospital Stay: Payer: 59 | Attending: Hematology

## 2021-11-02 ENCOUNTER — Other Ambulatory Visit: Payer: 59

## 2021-11-02 DIAGNOSIS — R531 Weakness: Secondary | ICD-10-CM | POA: Insufficient documentation

## 2021-11-02 DIAGNOSIS — E785 Hyperlipidemia, unspecified: Secondary | ICD-10-CM | POA: Insufficient documentation

## 2021-11-02 DIAGNOSIS — Z79899 Other long term (current) drug therapy: Secondary | ICD-10-CM | POA: Diagnosis not present

## 2021-11-02 DIAGNOSIS — R519 Headache, unspecified: Secondary | ICD-10-CM | POA: Diagnosis not present

## 2021-11-02 DIAGNOSIS — I252 Old myocardial infarction: Secondary | ICD-10-CM | POA: Diagnosis not present

## 2021-11-02 DIAGNOSIS — D509 Iron deficiency anemia, unspecified: Secondary | ICD-10-CM | POA: Insufficient documentation

## 2021-11-02 DIAGNOSIS — Z801 Family history of malignant neoplasm of trachea, bronchus and lung: Secondary | ICD-10-CM | POA: Insufficient documentation

## 2021-11-02 DIAGNOSIS — R5383 Other fatigue: Secondary | ICD-10-CM | POA: Insufficient documentation

## 2021-11-02 LAB — CBC WITH DIFFERENTIAL/PLATELET
Abs Immature Granulocytes: 0.02 10*3/uL (ref 0.00–0.07)
Basophils Absolute: 0 10*3/uL (ref 0.0–0.1)
Basophils Relative: 1 %
Eosinophils Absolute: 0.2 10*3/uL (ref 0.0–0.5)
Eosinophils Relative: 3 %
HCT: 37.2 % (ref 36.0–46.0)
Hemoglobin: 11.9 g/dL — ABNORMAL LOW (ref 12.0–15.0)
Immature Granulocytes: 0 %
Lymphocytes Relative: 31 %
Lymphs Abs: 2 10*3/uL (ref 0.7–4.0)
MCH: 26.3 pg (ref 26.0–34.0)
MCHC: 32 g/dL (ref 30.0–36.0)
MCV: 82.3 fL (ref 80.0–100.0)
Monocytes Absolute: 0.4 10*3/uL (ref 0.1–1.0)
Monocytes Relative: 6 %
Neutro Abs: 3.9 10*3/uL (ref 1.7–7.7)
Neutrophils Relative %: 59 %
Platelets: 222 10*3/uL (ref 150–400)
RBC: 4.52 MIL/uL (ref 3.87–5.11)
RDW: 20.8 % — ABNORMAL HIGH (ref 11.5–15.5)
WBC: 6.5 10*3/uL (ref 4.0–10.5)
nRBC: 0 % (ref 0.0–0.2)

## 2021-11-02 LAB — IRON AND TIBC
Iron: 32 ug/dL (ref 28–170)
Saturation Ratios: 8 % — ABNORMAL LOW (ref 10.4–31.8)
TIBC: 413 ug/dL (ref 250–450)
UIBC: 381 ug/dL

## 2021-11-02 LAB — FERRITIN: Ferritin: 57 ng/mL (ref 11–307)

## 2021-11-07 NOTE — Progress Notes (Unsigned)
[place], [place] [address]   CLINIC:  Medical Oncology/Hematology  PCP:  Gwenlyn Perking, [place] [address] [phone]   REASON FOR VISIT:  Follow-up for severe iron deficiency anemia  PRIOR THERAPY: Oral iron  CURRENT THERAPY: Intermittent IV iron  INTERVAL HISTORY:  Chelsea Snyder 41 y.o. female returns for routine follow-up of her iron deficiency anemia.  She was last seen by Dr. Delton Coombes on 09/11/2021.  She received IV iron with Venofer 1000 mg in September 2023.  At today's visit, she reports feeling fairly well.  No recent hospitalizations, surgeries, or changes in baseline health status.  She reports feeling some slight improvement after her IV iron.  She does not have as much cold intolerance.  She remains fatigued, but reports that this may also be due to poor sleep quality and her husband's recent heart attack.  She continues to report heavy menstrual cycles.  She denies any hematochezia or melena.  She continues to have some residual fatigue and headaches.  No pica or lightheadedness.  She has 75% energy and 100% appetite. She endorses that she is maintaining a stable weight.   REVIEW OF SYSTEMS:  Review of Systems  Constitutional:  Positive for fatigue. Negative for appetite change, chills, diaphoresis, fever and unexpected weight change.  HENT:   Negative for lump/mass and nosebleeds.   Eyes:  Negative for eye problems.  Respiratory:  Negative for cough, hemoptysis and shortness of breath.   Cardiovascular:  Negative for chest pain, leg swelling and palpitations.  Gastrointestinal:  Negative for abdominal pain, blood in stool, constipation, diarrhea, nausea and vomiting.  Genitourinary:  Positive for menstrual problem (Menorrhagia). Negative for hematuria.   Skin: Negative.   Neurological:  Positive for headaches. Negative for dizziness and light-headedness.  Hematological:  Does not bruise/bleed  easily.  Psychiatric/Behavioral:  Positive for sleep disturbance.       PAST MEDICAL/SURGICAL HISTORY:  Past Medical History:  Diagnosis Date   Hyperlipidemia    Migraine    Past Surgical History:  Procedure Laterality Date   CESAREAN SECTION       SOCIAL HISTORY:  Social History   Socioeconomic History   Marital status: Married    Spouse name: Not on file   Number of children: Not on file   Years of education: Not on file   Highest education level: Not on file  Occupational History   Not on file  Tobacco Use   Smoking status: Never   Smokeless tobacco: Never  Vaping Use   Vaping Use: Never used  Substance and Sexual Activity   Alcohol use: No   Drug use: No   Sexual activity: Not on file  Other Topics Concern   Not on file  Social History Narrative   Not on file   Social Determinants of Health   Financial Resource Strain: Not on file  Food Insecurity: Not on file  Transportation Needs: Not on file  Physical Activity: Not on file  Stress: Not on file  Social Connections: Not on file  Intimate Partner Violence: Not on file    FAMILY HISTORY:  Family History  Problem Relation Age of Onset   Cancer Mother        lung   Stroke Father     CURRENT MEDICATIONS:  Outpatient Encounter Medications as of 11/09/2021  Medication Sig   Insulin Pen Needle 32G X 6 MM MISC 1 each by Does not apply route  daily.   rizatriptan (MAXALT) 10 MG tablet Take 1 tablet (10 mg total) by mouth as needed for migraine. May repeat in 2 hours if needed   Semaglutide-Weight Management 0.25 MG/0.5ML SOAJ Inject 0.25 mg into the skin once a week.   [START ON 11/24/2021] Semaglutide-Weight Management 0.5 MG/0.5ML SOAJ Inject 0.5 mg into the skin once a week.   [START ON 12/23/2021] Semaglutide-Weight Management 1 MG/0.5ML SOAJ Inject 1 mg into the skin once a week.   [START ON 01/21/2022] Semaglutide-Weight Management 1.7 MG/0.75ML SOAJ Inject 1.7 mg into the skin once a week.    [START ON 02/19/2022] Semaglutide-Weight Management 2.4 MG/0.75ML SOAJ Inject 2.4 mg into the skin once a week.   traZODone (DESYREL) 100 MG tablet Take 1 tablet (100 mg total) by mouth at bedtime.   No facility-administered encounter medications on file as of 11/09/2021.    ALLERGIES:  Allergies  Allergen Reactions   Albuterol Itching   Keflex [Cephalexin] Rash     PHYSICAL EXAM:  ECOG PERFORMANCE STATUS: 1 - Symptomatic but completely ambulatory  There were no vitals filed for this visit. There were no vitals filed for this visit. Physical Exam Constitutional:      Appearance: Normal appearance. She is obese.  HENT:     Head: Normocephalic and atraumatic.     Mouth/Throat:     Mouth: Mucous membranes are moist.  Eyes:     Extraocular Movements: Extraocular movements intact.     Pupils: Pupils are equal, round, and reactive to light.  Cardiovascular:     Rate and Rhythm: Normal rate and regular rhythm.     Pulses: Normal pulses.     Heart sounds: Normal heart sounds.  Pulmonary:     Effort: Pulmonary effort is normal.     Breath sounds: Normal breath sounds.  Abdominal:     General: Bowel sounds are normal.     Palpations: Abdomen is soft.     Tenderness: There is no abdominal tenderness.  Musculoskeletal:        General: No swelling.     Right lower leg: No edema.     Left lower leg: No edema.  Lymphadenopathy:     Cervical: No cervical adenopathy.  Skin:    General: Skin is warm and dry.  Neurological:     General: No focal deficit present.     Mental Status: She is alert and oriented to person, place, and time.  Psychiatric:        Mood and Affect: Mood normal.        Behavior: Behavior normal.      LABORATORY DATA:  I have reviewed the labs as listed.  CBC    Component Value Date/Time   WBC 6.5 11/02/2021 1609   RBC 4.52 11/02/2021 1609   HGB 11.9 (L) 11/02/2021 1609   HGB 9.2 (L) 08/20/2021 1636   HCT 37.2 11/02/2021 1609   HCT 30.1 (L)  08/20/2021 1636   PLT 222 11/02/2021 1609   PLT 241 08/20/2021 1636   MCV 82.3 11/02/2021 1609   MCV 73 (L) 08/20/2021 1636   MCH 26.3 11/02/2021 1609   MCHC 32.0 11/02/2021 1609   RDW 20.8 (H) 11/02/2021 1609   RDW 16.0 (H) 08/20/2021 1636   LYMPHSABS 2.0 11/02/2021 1609   LYMPHSABS 2.1 08/20/2021 1636   MONOABS 0.4 11/02/2021 1609   EOSABS 0.2 11/02/2021 1609   EOSABS 0.1 08/20/2021 1636   BASOSABS 0.0 11/02/2021 1609   BASOSABS 0.0 08/20/2021 1636  Latest Ref Rng & Units 05/28/2021    4:30 PM 03/25/2020    4:03 PM 10/10/2018   11:39 AM  CMP  Glucose 70 - 99 mg/dL 83  85  83   BUN 6 - 24 mg/dL 9  5  8    Creatinine 0.57 - 1.00 mg/dL 0.66  0.68  0.61   Sodium 134 - 144 mmol/L 138  141  141   Potassium 3.5 - 5.2 mmol/L 4.3  4.2  4.7   Chloride 96 - 106 mmol/L 102  101  102   CO2 20 - 29 mmol/L 23  24  25    Calcium 8.7 - 10.2 mg/dL 9.8  9.5  9.5   Total Protein 6.0 - 8.5 g/dL 6.7  6.7  6.4   Total Bilirubin 0.0 - 1.2 mg/dL 0.3  0.3  0.2   Alkaline Phos 44 - 121 IU/L 37  39  44   AST 0 - 40 IU/L 13  14  14    ALT 0 - 32 IU/L 7  9  9      DIAGNOSTIC IMAGING:  I have independently reviewed the relevant imaging and discussed with the patient.  ASSESSMENT & PLAN: 1.  Iron deficiency anemia - Referred by PCP due to severe iron deficiency anemia (Hgb 9.2/MCV 73, ferritin 7, saturation 4%), which was refractory to oral iron supplementation -- Unable to tolerate oral iron due to gastric upset - Patient reports heavy periods (menses occurring every 21 to 24 days, lasting 6 to 7 days with 5 days of heavy bleeding) - No history of blood transfusion - No hematochezia or melena. - Received 1000 mg Venofer in September 2023 - Most recent labs (11/02/2021) show improved hemoglobin 11.9/MCV 82.3, ferritin 57, iron saturation 8% - PLAN: Recommend additional IV Venofer 300 mg x 3. - Repeat labs with PHONE visit in 12 weeks   2.  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  Non-smoker.  No chemical exposure. - No family history of anemia requiring transfusions. - Mother had lung cancer.  Maternal grandmother had lung cancer.  Both were smokers.   All questions were answered. The patient knows to call the clinic with any problems, questions or concerns.  Medical decision making: Low  Time spent on visit: I spent 15 minutes counseling the patient face to face. The total time spent in the appointment was 22 minutes and more than 50% was on counseling.   Harriett Rush, PA-C  11/09/2021 4:12 PM

## 2021-11-09 ENCOUNTER — Inpatient Hospital Stay (HOSPITAL_BASED_OUTPATIENT_CLINIC_OR_DEPARTMENT_OTHER): Payer: 59 | Admitting: Physician Assistant

## 2021-11-09 VITALS — BP 122/73 | HR 65 | Temp 98.7°F | Resp 16 | Wt 207.0 lb

## 2021-11-09 DIAGNOSIS — D500 Iron deficiency anemia secondary to blood loss (chronic): Secondary | ICD-10-CM

## 2021-11-09 DIAGNOSIS — D509 Iron deficiency anemia, unspecified: Secondary | ICD-10-CM | POA: Diagnosis not present

## 2021-11-09 NOTE — Patient Instructions (Signed)
[facility] [place] **   You were seen today by Tarri Abernethy PA-C for your iron deficiency anemia.    IRON DEFICIENCY ANEMIA Your iron deficiency anemia is most likely secondary to your heavy periods.  Follow-up with your gynecologist to discuss treatment options for your heavy periods. Your blood and iron levels have improved after the IV iron, but are not yet where they should be. We will schedule you for IV iron every 2 weeks x 3 doses.  LABS: Return in 3 months for repeat labs  FOLLOW-UP APPOINTMENT: Phone visit 1 week after labs  ** Thank you for trusting me with your healthcare!  I strive to provide all of my patients with quality care at each visit.  If you receive a survey for this visit, I would be so grateful to you for taking the time to provide feedback.  Thank you in advance!  ~ Elijha Dedman                   Dr. Derek Jack   &   Tarri Abernethy, PA-C   - - - - - - - - - - - - - - - - - -    Thank you for choosing [facility] at [facility] to provide your oncology and hematology care.  To afford each patient quality time with our provider, please arrive at least 15 minutes before your scheduled appointment time.   If you have a lab appointment with the [facility] please come in thru the Main Entrance and check in at the main information desk.  You need to re-schedule your appointment should you arrive 10 or more minutes late.  We strive to give you quality time with our providers, and arriving late affects you and other patients whose appointments are after yours.  Also, if you no show three or more times for appointments you may be dismissed from the clinic at the providers discretion.     Again, thank you for choosing [facility].  Our hope is that these requests will decrease the amount of time that you wait before being seen by our physicians.        _____________________________________________________________  Should you have questions after your visit to [facility], please contact our office at [phone] and follow the prompts.  Our office hours are 8:00 a.m. and 4:30 p.m. Monday - Friday.  Please note that voicemails left after 4:00 p.m. may not be returned until the following business day.  We are closed weekends and major holidays.  You do have access to a nurse 24-7, just call the main number to the clinic [phone] and do not press any options, hold on the line and a nurse will answer the phone.    For prescription refill requests, have your pharmacy contact our office and allow 72 hours.

## 2021-11-15 ENCOUNTER — Encounter: Payer: Self-pay | Admitting: Family Medicine

## 2021-11-15 DIAGNOSIS — G43009 Migraine without aura, not intractable, without status migrainosus: Secondary | ICD-10-CM

## 2021-11-16 MED ORDER — RIZATRIPTAN BENZOATE 10 MG PO TABS: 10.0000 mg | ORAL_TABLET | ORAL | 3 refills | Status: DC | PRN

## 2021-11-19 ENCOUNTER — Inpatient Hospital Stay: Payer: 59 | Attending: Hematology

## 2021-11-19 VITALS — BP 107/58 | HR 76 | Temp 98.5°F | Resp 18

## 2021-11-19 DIAGNOSIS — D509 Iron deficiency anemia, unspecified: Secondary | ICD-10-CM | POA: Diagnosis not present

## 2021-11-19 DIAGNOSIS — D500 Iron deficiency anemia secondary to blood loss (chronic): Secondary | ICD-10-CM

## 2021-11-19 MED ORDER — LORATADINE 10 MG PO TABS
10.0000 mg | ORAL_TABLET | Freq: Once | ORAL | Status: AC
  Administered 2021-11-19: 10 mg via ORAL
  Filled 2021-11-19: qty 1

## 2021-11-19 MED ORDER — SODIUM CHLORIDE 0.9 % IV SOLN: Freq: Once | INTRAVENOUS | Status: AC

## 2021-11-19 MED ORDER — ACETAMINOPHEN 325 MG PO TABS
650.0000 mg | ORAL_TABLET | Freq: Once | ORAL | Status: AC
  Administered 2021-11-19: 650 mg via ORAL
  Filled 2021-11-19: qty 2

## 2021-11-19 MED ORDER — SODIUM CHLORIDE 0.9 % IV SOLN
300.0000 mg | Freq: Once | INTRAVENOUS | Status: AC
  Administered 2021-11-19: 300 mg via INTRAVENOUS
  Filled 2021-11-19: qty 5

## 2021-11-19 NOTE — Patient Instructions (Signed)
[facility]  Discharge Instructions: Thank you for choosing [facility] to provide your oncology and hematology care.  If you have a lab appointment with the Cancer Center, please come in thru the Main Entrance and check in at the main information desk.  Wear comfortable clothing and clothing appropriate for easy access to any Portacath or PICC line.   We strive to give you quality time with your provider. You may need to reschedule your appointment if you arrive late (15 or more minutes).  Arriving late affects you and other patients whose appointments are after yours.  Also, if you miss three or more appointments without notifying the office, you may be dismissed from the clinic at the provider's discretion.      For prescription refill requests, have your pharmacy contact our office and allow 72 hours for refills to be completed.    Today you received the following Venofer, return as scheduled.   To help prevent nausea and vomiting after your treatment, we encourage you to take your nausea medication as directed.  BELOW ARE SYMPTOMS THAT SHOULD BE REPORTED IMMEDIATELY: *FEVER GREATER THAN 100.4 F (38 C) OR HIGHER *CHILLS OR SWEATING *NAUSEA AND VOMITING THAT IS NOT CONTROLLED WITH YOUR NAUSEA MEDICATION *UNUSUAL SHORTNESS OF BREATH *UNUSUAL BRUISING OR BLEEDING *URINARY PROBLEMS (pain or burning when urinating, or frequent urination) *BOWEL PROBLEMS (unusual diarrhea, constipation, pain near the anus) TENDERNESS IN MOUTH AND THROAT WITH OR WITHOUT PRESENCE OF ULCERS (sore throat, sores in mouth, or a toothache) UNUSUAL RASH, SWELLING OR PAIN  UNUSUAL VAGINAL DISCHARGE OR ITCHING   Items with * indicate a potential emergency and should be followed up as soon as possible or go to the Emergency Department if any problems should occur.  Please show the CHEMOTHERAPY ALERT CARD or IMMUNOTHERAPY ALERT CARD at check-in to the Emergency Department and triage  nurse.  Should you have questions after your visit or need to cancel or reschedule your appointment, please contact [facility] [phone]  and follow the prompts.  Office hours are 8:00 a.m. to 4:30 p.m. Monday - Friday. Please note that voicemails left after 4:00 p.m. may not be returned until the following business day.  We are closed weekends and major holidays. You have access to a nurse at all times for urgent questions. Please call the main number to the clinic [phone] and follow the prompts.  For any non-urgent questions, you may also contact your provider using MyChart. We now offer e-Visits for anyone 18 and older to request care online for non-urgent symptoms. For details visit [email or web address].   Also download the MyChart app! Go to the app store, search "MyChart", open the app, select [facility], and log in with your MyChart username and password.  Masks are optional in the cancer centers. If you would like for your care team to wear a mask while they are taking care of you, please let them know. You may have one support person who is at least 43 years old accompany you for your appointments.  

## 2021-11-19 NOTE — Progress Notes (Signed)
Patient tolerated iron infusion with no complaints voiced.  Peripheral IV site clean and dry with good blood return noted before and after infusion.  Band aid applied.  VSS with discharge and left in satisfactory condition with no s/s of distress noted.   

## 2021-12-07 ENCOUNTER — Inpatient Hospital Stay: Payer: 59

## 2021-12-07 VITALS — BP 109/66 | HR 67 | Temp 97.8°F | Resp 18

## 2021-12-07 DIAGNOSIS — D500 Iron deficiency anemia secondary to blood loss (chronic): Secondary | ICD-10-CM

## 2021-12-07 DIAGNOSIS — D509 Iron deficiency anemia, unspecified: Secondary | ICD-10-CM | POA: Diagnosis not present

## 2021-12-07 MED ORDER — ACETAMINOPHEN 325 MG PO TABS
650.0000 mg | ORAL_TABLET | Freq: Once | ORAL | Status: AC
  Administered 2021-12-07: 650 mg via ORAL
  Filled 2021-12-07: qty 2

## 2021-12-07 MED ORDER — LORATADINE 10 MG PO TABS
10.0000 mg | ORAL_TABLET | Freq: Once | ORAL | Status: AC
  Administered 2021-12-07: 10 mg via ORAL
  Filled 2021-12-07: qty 1

## 2021-12-07 MED ORDER — SODIUM CHLORIDE 0.9 % IV SOLN: Freq: Once | INTRAVENOUS | Status: AC

## 2021-12-07 MED ORDER — SODIUM CHLORIDE 0.9 % IV SOLN
300.0000 mg | Freq: Once | INTRAVENOUS | Status: AC
  Administered 2021-12-07: 300 mg via INTRAVENOUS
  Filled 2021-12-07: qty 300

## 2021-12-07 NOTE — Progress Notes (Signed)
Patient presents today for Venofer 300 mg . Patient has no complaints of any changes since her last iron infusion. Patient has no complaints of any side effects related to iron infusion. MAR reviewed. Vital signs stable.   Venofer given today per MD orders. Tolerated infusion without adverse affects. Vital signs stable. No complaints at this time. Discharged from clinic ambulatory in stable condition. Alert and oriented x 3. F/U with [facility] as scheduled.

## 2021-12-07 NOTE — Patient Instructions (Signed)
[facility]  Discharge Instructions: Thank you for choosing [facility] to provide your oncology and hematology care.  If you have a lab appointment with the Cancer Center, please come in thru the Main Entrance and check in at the main information desk.  Wear comfortable clothing and clothing appropriate for easy access to any Portacath or PICC line.   We strive to give you quality time with your provider. You may need to reschedule your appointment if you arrive late (15 or more minutes).  Arriving late affects you and other patients whose appointments are after yours.  Also, if you miss three or more appointments without notifying the office, you may be dismissed from the clinic at the provider's discretion.      For prescription refill requests, have your pharmacy contact our office and allow 72 hours for refills to be completed.    Today you received the following chemotherapy and/or immunotherapy agents Venofer 300 mg. Iron Sucrose Injection What is this medication? IRON SUCROSE (EYE ern SOO krose) treats low levels of iron (iron deficiency anemia) in people with kidney disease. Iron is a mineral that plays an important role in making red blood cells, which carry oxygen from your lungs to the rest of your body. This medicine may be used for other purposes; ask your health care provider or pharmacist if you have questions. COMMON BRAND NAME(S): Venofer What should I tell my care team before I take this medication? They need to know if you have any of these conditions: Anemia not caused by low iron levels Heart disease High levels of iron in the blood Kidney disease Liver disease An unusual or allergic reaction to iron, other medications, foods, dyes, or preservatives Pregnant or trying to get pregnant Breastfeeding How should I use this medication? This medication is for infusion into a vein. It is given in a hospital or clinic setting. Talk to your  care team about the use of this medication in children. While this medication may be prescribed for children as young as 2 years for selected conditions, precautions do apply. Overdosage: If you think you have taken too much of this medicine contact a poison control center or emergency room at once. NOTE: This medicine is only for you. Do not share this medicine with others. What if I miss a dose? Keep appointments for follow-up doses. It is important not to miss your dose. Call your care team if you are unable to keep an appointment. What may interact with this medication? Do not take this medication with any of the following: Deferoxamine Dimercaprol Other iron products This medication may also interact with the following: Chloramphenicol Deferasirox This list may not describe all possible interactions. Give your health care provider a list of all the medicines, herbs, non-prescription drugs, or dietary supplements you use. Also tell them if you smoke, drink alcohol, or use illegal drugs. Some items may interact with your medicine. What should I watch for while using this medication? Visit your care team regularly. Tell your care team if your symptoms do not start to get better or if they get worse. You may need blood work done while you are taking this medication. You may need to follow a special diet. Talk to your care team. Foods that contain iron include: whole grains/cereals, dried fruits, beans, or peas, leafy green vegetables, and organ meats (liver, kidney). What side effects may I notice from receiving this medication? Side effects that you should report to   your care team as soon as possible: Allergic reactions--skin rash, itching, hives, swelling of the face, lips, tongue, or throat Low blood pressure--dizziness, feeling faint or lightheaded, blurry vision Shortness of breath Side effects that usually do not require medical attention (report to your care team if they continue or are  bothersome): Flushing Headache Joint pain Muscle pain Nausea Pain, redness, or irritation at injection site This list may not describe all possible side effects. Call your doctor for medical advice about side effects. You may report side effects to FDA at 1-800-FDA-1088. Where should I keep my medication? This medication is given in a hospital or clinic and will not be stored at home. NOTE: This sheet is a summary. It may not cover all possible information. If you have questions about this medicine, talk to your doctor, pharmacist, or health care provider.  2023 Elsevier/Gold Standard (2020-04-10 00:00:00)       To help prevent nausea and vomiting after your treatment, we encourage you to take your nausea medication as directed.  BELOW ARE SYMPTOMS THAT SHOULD BE REPORTED IMMEDIATELY: *FEVER GREATER THAN 100.4 F (38 C) OR HIGHER *CHILLS OR SWEATING *NAUSEA AND VOMITING THAT IS NOT CONTROLLED WITH YOUR NAUSEA MEDICATION *UNUSUAL SHORTNESS OF BREATH *UNUSUAL BRUISING OR BLEEDING *URINARY PROBLEMS (pain or burning when urinating, or frequent urination) *BOWEL PROBLEMS (unusual diarrhea, constipation, pain near the anus) TENDERNESS IN MOUTH AND THROAT WITH OR WITHOUT PRESENCE OF ULCERS (sore throat, sores in mouth, or a toothache) UNUSUAL RASH, SWELLING OR PAIN  UNUSUAL VAGINAL DISCHARGE OR ITCHING   Items with * indicate a potential emergency and should be followed up as soon as possible or go to the Emergency Department if any problems should occur.  Please show the CHEMOTHERAPY ALERT CARD or IMMUNOTHERAPY ALERT CARD at check-in to the Emergency Department and triage nurse.  Should you have questions after your visit or need to cancel or reschedule your appointment, please contact [facility] [phone]  and follow the prompts.  Office hours are 8:00 a.m. to 4:30 p.m. Monday - Friday. Please note that voicemails left after 4:00 p.m. may not be returned until  the following business day.  We are closed weekends and major holidays. You have access to a nurse at all times for urgent questions. Please call the main number to the clinic [phone] and follow the prompts.  For any non-urgent questions, you may also contact your provider using MyChart. We now offer e-Visits for anyone 18 and older to request care online for non-urgent symptoms. For details visit [email or web address].   Also download the MyChart app! Go to the app store, search "MyChart", open the app, select [facility], and log in with your MyChart username and password.  Masks are optional in the cancer centers. If you would like for your care team to wear a mask while they are taking care of you, please let them know. You may have one support person who is at least 43 years old accompany you for your appointments.  

## 2021-12-25 ENCOUNTER — Inpatient Hospital Stay: Payer: 59 | Attending: Hematology

## 2021-12-25 VITALS — BP 118/81 | HR 63 | Temp 98.4°F | Resp 18

## 2021-12-25 DIAGNOSIS — D509 Iron deficiency anemia, unspecified: Secondary | ICD-10-CM | POA: Diagnosis present

## 2021-12-25 DIAGNOSIS — D500 Iron deficiency anemia secondary to blood loss (chronic): Secondary | ICD-10-CM

## 2021-12-25 MED ORDER — SODIUM CHLORIDE 0.9 % IV SOLN
300.0000 mg | Freq: Once | INTRAVENOUS | Status: AC
  Administered 2021-12-25: 300 mg via INTRAVENOUS
  Filled 2021-12-25: qty 300

## 2021-12-25 MED ORDER — SODIUM CHLORIDE 0.9 % IV SOLN: Freq: Once | INTRAVENOUS | Status: AC

## 2021-12-25 NOTE — Progress Notes (Signed)
Patient took premeds at home 0715.  Patient tolerated iron infusion with no complaints voiced.  Peripheral IV site clean and dry with good blood return noted before and after infusion.  Band aid applied.  VSS with discharge and left in satisfactory condition with no s/s of distress noted.

## 2022-01-11 DIAGNOSIS — M6530 Trigger finger, unspecified finger: Secondary | ICD-10-CM

## 2022-01-11 HISTORY — DX: Trigger finger, unspecified finger: M65.30

## 2022-02-04 ENCOUNTER — Telehealth: Payer: Self-pay

## 2022-02-04 NOTE — Telephone Encounter (Signed)
Chelsea Snyder (Key: [name]) Chelsea Snyder 18MG Fayne Mediate pen-injectors Form [employer] Electronic PA Form [id number] NCPDP) Created 1 day ago Sent to Plan 21 minutes ago Plan Response 21 minutes ago Submit Clinical Questions less than a minute ago Determination Wait for Determination Please wait for [employer] 2017 to return a determination.

## 2022-02-05 ENCOUNTER — Encounter: Payer: Self-pay | Admitting: Hematology

## 2022-02-05 ENCOUNTER — Other Ambulatory Visit (HOSPITAL_COMMUNITY): Payer: Self-pay

## 2022-02-05 NOTE — Telephone Encounter (Signed)
Patient Advocate Encounter  Prior Authorization for Saxenda 18mg /48ml has been approved.    PA# [name] CaseID: [id number] Effective dates: 01/05/22 through 02/04/23

## 2022-02-19 ENCOUNTER — Inpatient Hospital Stay: Payer: 59 | Attending: Hematology

## 2022-02-19 DIAGNOSIS — D500 Iron deficiency anemia secondary to blood loss (chronic): Secondary | ICD-10-CM

## 2022-02-19 DIAGNOSIS — N920 Excessive and frequent menstruation with regular cycle: Secondary | ICD-10-CM | POA: Diagnosis not present

## 2022-02-19 DIAGNOSIS — D509 Iron deficiency anemia, unspecified: Secondary | ICD-10-CM | POA: Diagnosis present

## 2022-02-19 LAB — CBC WITH DIFFERENTIAL/PLATELET
Abs Immature Granulocytes: 0.01 10*3/uL (ref 0.00–0.07)
Basophils Absolute: 0 10*3/uL (ref 0.0–0.1)
Basophils Relative: 1 %
Eosinophils Absolute: 0.2 10*3/uL (ref 0.0–0.5)
Eosinophils Relative: 3 %
HCT: 37.8 % (ref 36.0–46.0)
Hemoglobin: 12.5 g/dL (ref 12.0–15.0)
Immature Granulocytes: 0 %
Lymphocytes Relative: 30 %
Lymphs Abs: 1.7 10*3/uL (ref 0.7–4.0)
MCH: 29.4 pg (ref 26.0–34.0)
MCHC: 33.1 g/dL (ref 30.0–36.0)
MCV: 88.9 fL (ref 80.0–100.0)
Monocytes Absolute: 0.4 10*3/uL (ref 0.1–1.0)
Monocytes Relative: 6 %
Neutro Abs: 3.4 10*3/uL (ref 1.7–7.7)
Neutrophils Relative %: 60 %
Platelets: 256 10*3/uL (ref 150–400)
RBC: 4.25 MIL/uL (ref 3.87–5.11)
RDW: 12.8 % (ref 11.5–15.5)
WBC: 5.7 10*3/uL (ref 4.0–10.5)
nRBC: 0 % (ref 0.0–0.2)

## 2022-02-19 LAB — FERRITIN: Ferritin: 55 ng/mL (ref 11–307)

## 2022-02-19 LAB — IRON AND TIBC
Iron: 64 ug/dL (ref 28–170)
Saturation Ratios: 18 % (ref 10.4–31.8)
TIBC: 361 ug/dL (ref 250–450)
UIBC: 297 ug/dL

## 2022-02-26 ENCOUNTER — Inpatient Hospital Stay (HOSPITAL_BASED_OUTPATIENT_CLINIC_OR_DEPARTMENT_OTHER): Payer: 59 | Admitting: Physician Assistant

## 2022-02-26 ENCOUNTER — Encounter: Payer: Self-pay | Admitting: Physician Assistant

## 2022-02-26 DIAGNOSIS — D500 Iron deficiency anemia secondary to blood loss (chronic): Secondary | ICD-10-CM

## 2022-02-26 NOTE — Progress Notes (Signed)
VIRTUAL VISIT via [facility]   I connected with Chelsea Snyder  on 02/26/22 at  12:18 PM by telephone and verified that I am speaking with the correct person using two identifiers.  Location: Patient: Home Provider: [facility]   I discussed the limitations, risks, security and privacy concerns of performing an evaluation and management service by telephone and the availability of in person appointments. I also discussed with the patient that there may be a patient responsible charge related to this service. The patient expressed understanding and agreed to proceed.  REASON FOR VISIT:  Follow-up for severe iron deficiency anemia   PRIOR THERAPY: Oral iron   CURRENT THERAPY: Intermittent IV iron  INTERVAL HISTORY:  Chelsea Snyder is contacted today for follow-up of her iron deficiency anemia.  She was last seen by Tarri Abernethy PA-C on 11/09/2021.  She received IV Venofer 300 mg x 3 in November/December 2023.  At today's visit, she reports feeling fairly well.  No recent hospitalizations, surgeries, or changes in baseline health status.   She reports feeling some slight improvement after her IV iron. She does not have as much cold intolerance.  She continues to report heavy menstrual cycles.  She denies any hematochezia or melena.  She continues to have some residual fatigue and headaches.  No pica or lightheadedness.  She has 80% energy and 100% appetite.  She reports that she is maintaining a stable weight.   REVIEW OF SYSTEMS:   Review of Systems  Constitutional:  Negative for chills, diaphoresis, fever, malaise/fatigue and weight loss.  Respiratory:  Positive for cough ("head cold"). Negative for shortness of breath.   Cardiovascular:  Negative for chest pain and palpitations.  Gastrointestinal:  Positive for diarrhea and nausea. Negative for abdominal pain, blood in stool, melena and vomiting.  Neurological:  Positive for  headaches. Negative for dizziness.     PHYSICAL EXAM: (per limitations of virtual telephone visit)  The patient is alert and oriented x 3, exhibiting adequate mentation, good mood, and ability to speak in full sentences and execute sound judgement.  ASSESSMENT & PLAN:  1.  Iron deficiency anemia - Referred by PCP due to severe iron deficiency anemia (Hgb 9.2/MCV 73, ferritin 7, saturation 4%), which was refractory to oral iron supplementation -- Unable to tolerate oral iron due to gastric upset - Patient reports heavy periods (menses occurring every 21 to 24 days, lasting 6 to 7 days with 5 days of heavy bleeding) - No history of blood transfusion - No hematochezia or melena. - Most recent IV iron with Venofer 300 mg x 3 in November/December 2023 - Most recent labs (02/19/2022) show resolution of anemia with Hgb 12.5/MCV 88.9.  Iron is improved with ferritin 55 and iron saturation 18%. - PLAN: Anemia has resolved, but she has inadequate iron storage to maintain her hemoglobin in the setting of chronic menorrhagia.  -- We will retrial oral iron (ferrous bisglycinate) to see if this is adequate to maintain iron storage now that initial iron deficit has been corrected. - Repeat labs with PHONE visit in 3 months   2.  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  Non-smoker.  No chemical exposure. - No family history of anemia requiring transfusions. - Mother had lung cancer.  Maternal grandmother had lung cancer.  Both were smokers.  PLAN SUMMARY: >> Labs in 3 months (CBC/D, ferritin, iron/TIBC) >> PHONE visit in 3 months (1  to 2 days after labs)     I discussed the assessment and treatment plan with the patient. The patient was provided an opportunity to ask questions and all were answered. The patient agreed with the plan and demonstrated an understanding of the instructions.   The patient was advised to call back or seek an in-person evaluation if the  symptoms worsen or if the condition fails to improve as anticipated.  I provided 18 minutes of non-face-to-face time during this encounter.   Harriett Rush, PA-C 02/26/22 12:44 PM

## 2022-03-01 ENCOUNTER — Other Ambulatory Visit: Payer: Self-pay

## 2022-03-01 DIAGNOSIS — D500 Iron deficiency anemia secondary to blood loss (chronic): Secondary | ICD-10-CM

## 2022-03-01 DIAGNOSIS — D509 Iron deficiency anemia, unspecified: Secondary | ICD-10-CM

## 2022-03-14 ENCOUNTER — Encounter: Payer: Self-pay | Admitting: Family Medicine

## 2022-04-08 ENCOUNTER — Encounter: Payer: Self-pay | Admitting: Family Medicine

## 2022-04-12 ENCOUNTER — Ambulatory Visit (INDEPENDENT_AMBULATORY_CARE_PROVIDER_SITE_OTHER): Payer: 59 | Admitting: Family

## 2022-04-12 VITALS — BP 125/85 | HR 95 | Temp 97.8°F | Ht 65.0 in | Wt 220.0 lb

## 2022-04-12 DIAGNOSIS — R519 Headache, unspecified: Secondary | ICD-10-CM

## 2022-04-12 DIAGNOSIS — D500 Iron deficiency anemia secondary to blood loss (chronic): Secondary | ICD-10-CM | POA: Diagnosis not present

## 2022-04-12 DIAGNOSIS — N920 Excessive and frequent menstruation with regular cycle: Secondary | ICD-10-CM

## 2022-04-12 DIAGNOSIS — R55 Syncope and collapse: Secondary | ICD-10-CM | POA: Diagnosis not present

## 2022-04-12 LAB — HEMOGLOBIN, FINGERSTICK: Hemoglobin: 12.7 g/dL (ref 11.1–15.9)

## 2022-04-12 NOTE — Patient Instructions (Signed)
Iron Deficiency Anemia, Adult  Iron deficiency anemia is a condition in which the concentration of red blood cells or hemoglobin in the blood is below normal because of too little iron. Hemoglobin is a substance in red blood cells that carries oxygen to the body's tissues. When the concentration of red blood cells or hemoglobin is too low, not enough oxygen reaches these tissues. Iron deficiency anemia is usually long-lasting, and it develops over time. It may or may not cause symptoms. It is a common type of anemia. What are the causes? This condition may be caused by: Not enough iron in the diet. Abnormal absorption in the gut. Blood loss. What increases the risk? You are more likely to develop this condition if you get menstrual periods (menstruate) or are pregnant. What are the signs or symptoms? Symptoms of this condition may include: Pale skin, lips, and nail beds. Weakness, dizziness, and getting tired easily. Shortness of breath when moving or exercising. Cold hands or feet. Mild anemia may not cause any symptoms. How is this diagnosed? This condition is diagnosed based on: Your medical history. A physical exam. Blood tests. How is this treated? This condition is treated by correcting the cause of your iron deficiency. Treatment may involve: Adding iron-rich foods to your diet. Taking iron supplements. If you are pregnant or breastfeeding, you may need to take extra iron because your normal diet usually does not provide the amount of iron that you need. Increasing vitamin C intake. Vitamin C helps your body absorb iron. Your health care provider may recommend that you take iron supplements along with a glass of orange juice or a vitamin C supplement. Medicines to make heavy menstrual flow lighter. Surgery or additional testing procedures to determine the cause of your anemia. You may need repeat blood tests to determine whether treatment is working. If the treatment does not  seem to be working, you may need more tests. Follow these instructions at home: Medicines Take over-the-counter and prescription medicines only as told by your health care provider. This includes iron supplements and vitamins. This is important because too much iron can be harmful. For the best iron absorption, you should take iron supplements when your stomach is empty. If you cannot tolerate them on an empty stomach, you may need to take them with food. Do not drink milk or take antacids at the same time as your iron supplements. Milk and antacids may interfere with how your body absorbs iron. Iron supplements may turn stool (feces) a darker color and it may appear black. If you cannot tolerate taking iron supplements by mouth, talk with your health care provider about taking them through an IV or through an injection into a muscle. Eating and drinking Talk with your health care provider before changing your diet. Your provider may recommend that you eat foods that contain a lot of iron, such as: Liver. Low-fat (lean) beef. Breads and cereals that have iron added to them (are fortified). Eggs. Dried fruit. Dark green, leafy vegetables. To help your body use the iron from iron-rich foods, eat those foods at the same time as fresh fruits and vegetables that are high in vitamin C. Foods that are high in vitamin C include: Oranges. Peppers. Tomatoes. Mangoes. Managing constipation If you are taking an iron supplement, it may cause constipation. To prevent or treat constipation, you may need to: Drink enough fluid to keep your urine pale yellow. Take over-the-counter or prescription medicines. Eat foods that are high in fiber, such   as beans, whole grains, and fresh fruits and vegetables. Limit foods that are high in fat and processed sugars, such as fried or sweet foods. General instructions Return to your normal activities as told by your health care provider. Ask your health care provider  what activities are safe for you. Keep all follow-up visits. Contact a health care provider if: You feel nauseous or you vomit. You feel weak. You become light-headed when getting up from a sitting or lying down position. You have unexplained sweating. You develop symptoms of constipation. You have a heaviness in your chest. You have trouble breathing with physical activity. Get help right away if: You faint. If this happens, do not drive yourself to the hospital. You have an irregular or rapid heartbeat. Summary Iron deficiency anemia is a condition in which the concentration of red blood cells or hemoglobin in the blood is below normal because of too little iron. This condition is treated by correcting the cause of your iron deficiency. Take over-the-counter and prescription medicines only as told by your health care provider. This includes iron supplements and vitamins. To help your body use the iron from iron-rich foods, eat those foods at the same time as fresh fruits and vegetables that are high in vitamin C. Seek medical help if you have signs or symptoms of worsening anemia. This information is not intended to replace advice given to you by your health care provider. Make sure you discuss any questions you have with your health care provider. Document Revised: 02/04/2021 Document Reviewed: 02/04/2021 Elsevier Patient Education  2023 Elsevier Inc.  

## 2022-04-12 NOTE — Progress Notes (Signed)
Subjective:    Patient ID: Chelsea Snyder, female    DOB: [birth year], 42 y.o.   MRN: [id number]  Chief Complaint  Patient presents with   Loss of Consciousness    Iron has been low haas had transfusion. HA and heavy period. She did fall and hit face and busted lip. She does fell like iron is dropping again   Pt presents to the office after a syncope episode earlier in the week. Reports she was sleeping on the couch and got up in the middle the night got up and fell and hit a coffee table and floor. She reports she has never "passed out " before.   Reports mild headache since falling. Denies any changes in gait, speech, or memory.   She has a hx of iron deficiency and has menorrhagia. When she passed out she was on day 9 of her cycle with heavy bleeding. Reports going through 4-5 pads a day with large clots. She has an appointment with GYN.    Loss of Consciousness This is a new problem. The current episode started in the past 7 days. The problem has been unchanged. She lost consciousness for a period of less than 1 minute. The symptoms are aggravated by standing. Associated symptoms include malaise/fatigue. Pertinent negatives include no bladder incontinence or bowel incontinence.  Anemia Symptoms include malaise/fatigue.      Review of Systems  Constitutional:  Positive for malaise/fatigue.  Cardiovascular:  Positive for syncope.  Gastrointestinal:  Negative for bowel incontinence.  Genitourinary:  Negative for bladder incontinence.  All other systems reviewed and are negative.      Objective:   Physical Exam Vitals reviewed.  Constitutional:      General: She is not in acute distress.    Appearance: She is well-developed.  HENT:     Head: Normocephalic and atraumatic.     Right Ear: External ear normal.  Eyes:     Pupils: Pupils are equal, round, and reactive to light.  Neck:     Thyroid: No thyromegaly.  Cardiovascular:     Rate and Rhythm: Normal rate and  regular rhythm.     Heart sounds: Normal heart sounds. No murmur heard. Pulmonary:     Effort: Pulmonary effort is normal. No respiratory distress.     Breath sounds: Normal breath sounds. No wheezing.  Abdominal:     General: Bowel sounds are normal. There is no distension.     Palpations: Abdomen is soft.     Tenderness: There is no abdominal tenderness.  Musculoskeletal:        General: No tenderness. Normal range of motion.     Cervical back: Normal range of motion and neck supple.  Skin:    General: Skin is warm and dry.     Coloration: Skin is pale.  Neurological:     Mental Status: She is alert and oriented to person, place, and time.     Cranial Nerves: No cranial nerve deficit.     Deep Tendon Reflexes: Reflexes are normal and symmetric.  Psychiatric:        Behavior: Behavior normal.        Thought Content: Thought content normal.        Judgment: Judgment normal.      BP 125/85   Pulse 95   Temp 97.8 F (36.6 C) (Temporal)   Ht 5\' 5"  (1.651 m)   Wt 220 lb (99.8 kg)   SpO2 98%   BMI 36.61 kg/m  Assessment & Plan:   Chelsea Snyder comes in today with chief complaint of Loss of Consciousness (Iron has been low haas had transfusion. HA and heavy period. She did fall and hit face and busted lip. She does fell like iron is dropping again)   Diagnosis and orders addressed:  1. Syncope, unspecified syncope type - EKG 12-Lead - Anemia Profile B - Hemoglobin, fingerstick  2. Iron deficiency anemia due to chronic blood loss - EKG 12-Lead - Anemia Profile B - Hemoglobin, fingerstick  3. Menorrhagia with regular cycle - EKG 12-Lead  4. Nonintractable headache, unspecified chronicity pattern, unspecified headache type Could be from possible concussion Avoid over stimulation Tylenol as needed Outside of window for bleed Report any changes in gait or speech  Labs pending Health Maintenance reviewed Diet and exercise encouraged  Follow up  plan: Keep chronic follow up with PCP  Evelina Dun, FNP

## 2022-04-13 LAB — ANEMIA PROFILE B
Basophils Absolute: 0 10*3/uL (ref 0.0–0.2)
Basos: 1 %
EOS (ABSOLUTE): 0.1 10*3/uL (ref 0.0–0.4)
Eos: 3 %
Ferritin: 41 ng/mL (ref 15–150)
Folate: 5 ng/mL (ref >3.0)
Hematocrit: 39.2 % (ref 34.0–46.6)
Hemoglobin: 12.9 g/dL (ref 11.1–15.9)
Immature Grans (Abs): 0 10*3/uL (ref 0.0–0.1)
Immature Granulocytes: 0 %
Iron Saturation: 25 % (ref 15–55)
Iron: 88 ug/dL (ref 27–159)
Lymphocytes Absolute: 1.6 10*3/uL (ref 0.7–3.1)
Lymphs: 32 %
MCH: 28.9 pg (ref 26.6–33.0)
MCHC: 32.9 g/dL (ref 31.5–35.7)
MCV: 88 fL (ref 79–97)
Monocytes Absolute: 0.3 10*3/uL (ref 0.1–0.9)
Monocytes: 6 %
Neutrophils Absolute: 3 10*3/uL (ref 1.4–7.0)
Neutrophils: 58 %
Platelets: 239 10*3/uL (ref 150–450)
RBC: 4.46 x10E6/uL (ref 3.77–5.28)
RDW: 12.3 % (ref 11.7–15.4)
Retic Ct Pct: 1.5 % (ref 0.6–2.6)
Total Iron Binding Capacity: 347 ug/dL (ref 250–450)
UIBC: 259 ug/dL (ref 131–425)
Vitamin B-12: 388 pg/mL (ref 232–1245)
WBC: 5 10*3/uL (ref 3.4–10.8)

## 2022-05-21 ENCOUNTER — Encounter: Payer: Self-pay | Admitting: Family Medicine

## 2022-05-21 ENCOUNTER — Inpatient Hospital Stay: Payer: 59

## 2022-05-21 ENCOUNTER — Ambulatory Visit (INDEPENDENT_AMBULATORY_CARE_PROVIDER_SITE_OTHER): Payer: 59 | Admitting: Family Medicine

## 2022-05-21 VITALS — BP 112/63 | HR 69 | Temp 98.2°F | Ht 65.0 in | Wt 234.2 lb

## 2022-05-21 DIAGNOSIS — M65311 Trigger thumb, right thumb: Secondary | ICD-10-CM

## 2022-05-21 MED ORDER — METHYLPREDNISOLONE ACETATE 40 MG/ML IJ SUSP
20.0000 mg | Freq: Once | INTRAMUSCULAR | Status: AC
Start: 2022-05-21 — End: 2022-05-21
  Administered 2022-05-21: 20 mg via INTRA_ARTICULAR

## 2022-05-21 NOTE — Progress Notes (Signed)
   Acute Office Visit  Subjective:     Patient ID: Chelsea Snyder, female    DOB: 24-Oct-1980, 42 y.o.   MRN: [id number]  Chief Complaint  Patient presents with   trigger thumb    HPI Patient is in today for pain of her right thumb for the last month. She reports that her right thumb has had locking, clicking, and decrease ROM that has been worsening. She has difficulty bending her thumb. If it does bend she has to use her bother hand to open it back up. She has had difficulty writing and griping because of this. She has failed rest, ice, heat, tylenol, NSAIDs. Denies injury.   ROS As per HPI.      Objective:    BP 112/63   Pulse 69   Temp 98.2 F (36.8 C) (Temporal)   Ht 5\' 5"  (1.651 m)   Wt 234 lb 4 oz (106.3 kg)   SpO2 99%   BMI 38.98 kg/m    Physical Exam Vitals and nursing note reviewed.  Constitutional:      General: She is not in acute distress.    Appearance: She is not ill-appearing, toxic-appearing or diaphoretic.  Pulmonary:     Effort: Pulmonary effort is normal. No respiratory distress.  Musculoskeletal:     Right hand: Tenderness (right thumb) present. No swelling, lacerations or bony tenderness. Decreased range of motion (right thumb). Normal capillary refill.  Skin:    General: Skin is warm and dry.  Neurological:     General: No focal deficit present.     Mental Status: She is alert and oriented to person, place, and time.  Psychiatric:        Mood and Affect: Mood normal.        Behavior: Behavior normal.     Hand/Upper Extremity Injection/Arthrocentesis: R thumb A1 for trigger finger on 05/21/2022 10:09 AM Indications: pain and tendon swelling Details: 18 G needle (palmer) approach Medications: (Methylprednisolone, 2% epi) Aspirate: clear Outcome: tolerated well, no immediate complications Procedure, treatment alternatives, risks and benefits explained, specific risks discussed. Consent was given by the patient. Immediately prior to  procedure a time out was called to verify the correct patient, procedure, equipment, support staff and site/side marked as required. Patient was prepped and draped in the usual sterile fashion.      No results found for any visits on 05/21/22.      Assessment & Plan:   [name] was seen today for trigger thumb.  Diagnoses and all orders for this visit:  Trigger finger of right thumb Trigger thumb injection today. Patient tolerated well. Discussed after care. Return to office for new or worsening symptoms, or if symptoms persist.  -     methylPREDNISolone acetate (DEPO-MEDROL) injection 20 mg -     Hand/Upper Extremity Injection/Arthrocentesis  The patient indicates understanding of these issues and agrees with the plan.  Gabriel Earing, FNP

## 2022-05-21 NOTE — Patient Instructions (Signed)
Trigger Finger  Trigger finger, also called stenosing tenosynovitis, is a condition that causes a finger or thumb to get stuck in a bent position. Each finger has tough, cord-like tissue (tendon) that connects muscle to bone, and each tendon passes through a tunnel of tissue (tendon sheath). The tendon sheaths are held close to the bone by a pulley. There is a pulley called the A1 pulley that is involved in the triggering of a finger or thumb. To move your finger, your tendon needs to glide freely through the sheath. Trigger finger happens when the tendon or the sheath thickens, making it difficult to bend or straighten your finger as the thickened tendon gets stuck in the A1 pulley. Trigger finger can affect any of the fingers or the thumbs. Mild cases may clear up with rest and medicine. Severe cases require more treatment. What are the causes? Trigger finger or thumb is caused by a thickened finger tendon or tendon sheath. The cause of this thickening is not known. What increases the risk? The following factors may make you more likely to develop this condition: Doing the same movements many times (repetitive activity) that require a strong grip. Having certain health conditions. These include rheumatoid arthritis, gout, carpal tunnel syndrome, or diabetes. Being 40-60 years old. Being female. What are the signs or symptoms? Symptoms of this condition include: Pain when bending or straightening your finger. Tenderness, swelling, or a lump in the palm of your hand just below the finger joint. Hearing a noise like a pop or a snap when you try to straighten your finger. Feeling a catch or locked feeling when you try to straighten your finger. Being unable to straighten your finger without help from your other hand. How is this diagnosed? This condition is diagnosed based on your symptoms and a physical exam. How is this treated? This condition may be treated by: Resting your finger and  avoiding activities that make symptoms worse. Wearing a finger splint to keep your finger extended. Taking NSAIDs, such as ibuprofen, to relieve pain and swelling around the tendon. Doing gentle exercises to stretch the finger as told by your health care provider. Having medicine that reduces swelling and inflammation (steroids) injected into the tendon sheath. Injections may need to be repeated. Trigger finger release. This surgery is done to open the pulley. This may be done if other treatments do not work and you cannot straighten or bend your finger. You may need hand therapy after surgery. Follow these instructions at home: If you have a removable splint: Wear the splint as told by your provider. Remove it only as told by your provider. Check the skin around the splint every day. Tell your provider about any concerns. Loosen the splint if your fingers tingle, become numb, or turn cold and blue. Keep the splint clean and dry. If the splint is not waterproof: Do not let it get wet. Cover it with a watertight covering when you take a bath or shower. Managing pain, stiffness, and swelling     If told, put ice on the painful area. If you have a removable splint, remove it as told by your health care provider. Put ice in a plastic bag. Place a towel between your skin and the bag or between your splint and the bag. Leave the ice on for 20 minutes, 2-3 times a day. If told, apply heat to the affected area as often as told by your provider. Use the heat source that your provider recommends, such as   a moist heat pack or a heating pad. Place a towel between your skin and the heat source. Leave the heat on for 20-30 minutes. If your skin turns bright red, remove the ice or heat right away to prevent skin damage. The risk of damage is higher if you cannot feel pain, heat, or cold. Activity Rest your finger as told by your provider. Avoid activities that make the pain worse. Return to your  normal activities as told by your provider. Ask your provider what activities are safe for you. Do exercises as told by your provider. Ask your provider when it is safe to drive if you have a splint on your hand. General instructions Take over-the-counter and prescription medicines only as told by your provider. Keep all follow-up visits. These are needed to see how you are progressing. Contact a health care provider if: Your symptoms are not improving with home care. This information is not intended to replace advice given to you by your health care provider. Make sure you discuss any questions you have with your health care provider. Document Revised: 08/14/2021 Document Reviewed: 08/14/2021 Elsevier Patient Education  2023 [employer].  

## 2022-05-28 ENCOUNTER — Inpatient Hospital Stay: Payer: 59 | Admitting: Physician Assistant

## 2022-06-02 ENCOUNTER — Encounter: Payer: Self-pay | Admitting: Family Medicine

## 2022-06-25 ENCOUNTER — Encounter (HOSPITAL_BASED_OUTPATIENT_CLINIC_OR_DEPARTMENT_OTHER): Payer: Self-pay | Admitting: Obstetrics and Gynecology

## 2022-06-29 ENCOUNTER — Encounter (HOSPITAL_BASED_OUTPATIENT_CLINIC_OR_DEPARTMENT_OTHER): Payer: Self-pay | Admitting: Obstetrics and Gynecology

## 2022-06-29 ENCOUNTER — Other Ambulatory Visit: Payer: Self-pay

## 2022-06-29 NOTE — Progress Notes (Addendum)
Spoke w/ via phone for pre-op interview---[facility] needs dos----urine pregnancy               Lab results------07/12/2022 lab appt for cbc, type & screen, 04/12/22 EKG in chart & Epic COVID test -----patient states asymptomatic no test needed Arrive at -------0700 on Tuesday, 07/20/22 NPO after MN NO Solid Food.  Clear liquids from MN until---0600 Med rec completed Medications to take morning of surgery -----Maxalt prn Diabetic medication -----n/a Patient instructed no nail polish to be worn day of surgery Patient instructed to bring photo id and insurance card day of surgery Patient aware to have Driver (ride ) / caregiver    for 24 hours after surgery - husband, Tim Patient Special Instructions -----Extended / overnight stay instructions given. Pre-Op special Instructions -----none Patient verbalized understanding of instructions that were given at this phone interview. Patient denies shortness of breath, chest pain, fever, cough at this phone interview.

## 2022-06-29 NOTE — Progress Notes (Signed)
Your procedure is scheduled on Tuesday, 07/20/2022.  Report to [facility] AT  7:00 AM.   Call this number if you have problems the morning of surgery  :[phone].   OUR ADDRESS IS [address].  WE ARE LOCATED IN [facility].  PLEASE BRING YOUR INSURANCE CARD AND PHOTO ID DAY OF SURGERY.  ONLY 2 PEOPLE ARE ALLOWED IN  WAITING  ROOM                                      REMEMBER:  DO NOT EAT FOOD, CANDY GUM OR MINTS  AFTER MIDNIGHT THE NIGHT BEFORE YOUR SURGERY . YOU MAY HAVE CLEAR LIQUIDS FROM MIDNIGHT THE NIGHT BEFORE YOUR SURGERY UNTIL  6:00 AM. NO CLEAR LIQUIDS AFTER   6:00 AM DAY OF SURGERY.  YOU MAY  BRUSH YOUR TEETH MORNING OF SURGERY AND RINSE YOUR MOUTH OUT, NO CHEWING GUM CANDY OR MINTS.     CLEAR LIQUID DIET    Allowed      Water                                                                   Coffee and tea, regular and decaf  (NO cream or milk products of any type, may sweeten)                         Carbonated beverages, regular and diet                                    Sports drinks like Gatorade _____________________________________________________________________     TAKE ONLY THESE MEDICATIONS MORNING OF SURGERY: Maxalt if needed                                        DO NOT WEAR JEWERLY/  METAL/  PIERCINGS (INCLUDING NO PLASTIC PIERCINGS) DO NOT WEAR LOTIONS, POWDERS, PERFUMES OR NAIL POLISH ON YOUR FINGERNAILS. TOENAIL POLISH IS OK TO WEAR. DO NOT SHAVE FOR 48 HOURS PRIOR TO DAY OF SURGERY.  CONTACTS, GLASSES, OR DENTURES MAY NOT BE WORN TO SURGERY.  REMEMBER: NO SMOKING, VAPING ,  DRUGS OR ALCOHOL FOR 24 HOURS BEFORE YOUR SURGERY.                                    [facility] IS NOT RESPONSIBLE  FOR ANY BELONGINGS.                                                                    Marland Kitchen           [facility] - Preparing for Surgery  Before surgery, you can play an important role.  Because skin is not  sterile, your skin needs to be as free of germs as possible.  You can reduce the number of germs on your skin by washing with CHG (chlorahexidine gluconate) soap before surgery.  CHG is an antiseptic cleaner which kills germs and bonds with the skin to continue killing germs even after washing. Please DO NOT use if you have an allergy to CHG or antibacterial soaps.  If your skin becomes reddened/irritated stop using the CHG and inform your nurse when you arrive at Short Stay. Do not shave (including legs and underarms) for at least 48 hours prior to the first CHG shower.  You may shave your face/neck. Please follow these instructions carefully:  1.  Shower with CHG Soap the night before surgery and the  morning of Surgery.  2.  If you choose to wash your hair, wash your hair first as usual with your  normal  shampoo.  3.  After you shampoo, rinse your hair and body thoroughly to remove the  shampoo.                                        4.  Use CHG as you would any other liquid soap.  You can apply chg directly  to the skin and wash , chg soap provided, night before and morning of your surgery.  5.  Apply the CHG Soap to your body ONLY FROM THE NECK DOWN.   Do not use on face/ open                           Wound or open sores. Avoid contact with eyes, ears mouth and genitals (private parts).                       Wash face,  Genitals (private parts) with your normal soap.             6.  Wash thoroughly, paying special attention to the area where your surgery  will be performed.  7.  Thoroughly rinse your body with warm water from the neck down.  8.  DO NOT shower/wash with your normal soap after using and rinsing off  the CHG Soap.             9.  Pat yourself dry with a clean towel.            10.  Wear clean pajamas.            11.  Place clean sheets on your bed the night of your first shower and do not  sleep with pets. Day of Surgery : Do not apply any lotions/ powders the morning of  surgery.  Please wear clean clothes to the hospital/surgery center.  IF YOU HAVE ANY SKIN IRRITATION OR PROBLEMS WITH THE SURGICAL SOAP, PLEASE GET A BAR OF GOLD DIAL SOAP AND SHOWER THE NIGHT BEFORE YOUR SURGERY AND THE MORNING OF YOUR SURGERY. PLEASE LET THE NURSE KNOW MORNING OF YOUR SURGERY IF YOU HAD ANY PROBLEMS WITH THE SURGICAL SOAP.   YOUR SURGEON MAY HAVE REQUESTED EXTENDED RECOVERY TIME AFTER YOUR SURGERY. IT COULD BE A  JUST A FEW HOURS  UP TO AN OVERNIGHT STAY.  YOUR SURGEON SHOULD HAVE DISCUSSED THIS WITH YOU PRIOR TO  YOUR SURGERY. IN THE EVENT YOU NEED TO STAY OVERNIGHT PLEASE REFER TO THE FOLLOWING GUIDELINES. YOU MAY HAVE UP TO 4 VISITORS  MAY VISIT IN THE EXTENDED RECOVERY ROOM UNTIL 800 PM ONLY.  ONE  VISITOR AGE 80 AND OVER MAY SPEND THE NIGHT AND MUST BE IN EXTENDED RECOVERY ROOM NO LATER THAN 800 PM . YOUR DISCHARGE TIME AFTER YOU SPEND THE NIGHT IS 900 AM THE MORNING AFTER YOUR SURGERY. YOU MAY PACK A SMALL OVERNIGHT BAG WITH TOILETRIES FOR YOUR OVERNIGHT STAY IF YOU WISH.  REGARDLESS OF IF YOU STAY OVER NIGHT OR ARE DISCHARGED THE SAME DAY YOU WILL BE REQUIRED TO HAVE A RESPONSIBLE ADULT (18 YRS OLD OR OLDER) STAY WITH YOU FOR AT LEAST THE FIRST 24 HOURS  YOUR PRESCRIPTION MEDICATIONS WILL BE PROVIDED DURING YOUR HOSPITAL STAY.  ________________________________________________________________________                                                        QUESTIONS Mechele Claude PRE OP NURSE PHONE [phone].

## 2022-07-12 ENCOUNTER — Encounter (HOSPITAL_COMMUNITY): Admission: RE | Admit: 2022-07-12 | Payer: 59 | Source: Ambulatory Visit

## 2022-07-12 ENCOUNTER — Encounter (HOSPITAL_COMMUNITY)
Admission: RE | Admit: 2022-07-12 | Discharge: 2022-07-12 | Disposition: A | Discharge status: Home / Self Care | Payer: 59 | Source: Ambulatory Visit | Attending: Obstetrics and Gynecology | Admitting: Obstetrics and Gynecology

## 2022-07-12 DIAGNOSIS — D251 Intramural leiomyoma of uterus: Secondary | ICD-10-CM | POA: Diagnosis not present

## 2022-07-12 DIAGNOSIS — Z01812 Encounter for preprocedural laboratory examination: Secondary | ICD-10-CM | POA: Insufficient documentation

## 2022-07-12 LAB — CBC
HCT: 38.1 % (ref 36.0–46.0)
Hemoglobin: 12.1 g/dL (ref 12.0–15.0)
MCH: 27.3 pg (ref 26.0–34.0)
MCHC: 31.8 g/dL (ref 30.0–36.0)
MCV: 86 fL (ref 80.0–100.0)
Platelets: 231 10*3/uL (ref 150–400)
RBC: 4.43 MIL/uL (ref 3.87–5.11)
RDW: 12.8 % (ref 11.5–15.5)
WBC: 8.4 10*3/uL (ref 4.0–10.5)
nRBC: 0 % (ref 0.0–0.2)

## 2022-07-12 LAB — TYPE AND SCREEN: ABO/RH(D): A NEG

## 2022-07-19 NOTE — Anesthesia Preprocedure Evaluation (Signed)
Anesthesia Evaluation  Patient identified by MRN, date of birth, ID band Patient awake    Reviewed: Allergy & Precautions, NPO status , Patient's Chart, lab work & pertinent test results  Airway Mallampati: II  TM Distance: >3 FB Neck ROM: Full    Dental no notable dental hx. (+) Dental Advisory Given, Teeth Intact   Pulmonary former smoker   Pulmonary exam normal breath sounds clear to auscultation       Cardiovascular negative cardio ROS Normal cardiovascular exam Rhythm:Regular Rate:Normal     Neuro/Psych  Headaches    GI/Hepatic negative GI ROS, Neg liver ROS,,,  Endo/Other    Morbid obesity  Renal/GU negative Renal ROS     Musculoskeletal negative musculoskeletal ROS (+)    Abdominal  (+) + obese  Peds  Hematology  (+) Blood dyscrasia, anemia   Anesthesia Other Findings   Reproductive/Obstetrics                             Anesthesia Physical Anesthesia Plan  ASA: 2  Anesthesia Plan: General   Post-op Pain Management: Tylenol PO (pre-op)* and Gabapentin PO (pre-op)*   Induction: Intravenous  PONV Risk Score and Plan: 4 or greater and Ondansetron, Dexamethasone, Treatment may vary due to age or medical condition, Midazolam and Scopolamine patch - Pre-op  Airway Management Planned: Oral ETT  Additional Equipment:   Intra-op Plan:   Post-operative Plan: Extubation in OR  Informed Consent: I have reviewed the patients History and Physical, chart, labs and discussed the procedure including the risks, benefits and alternatives for the proposed anesthesia with the patient or authorized representative who has indicated his/her understanding and acceptance.     Dental advisory given  Plan Discussed with: CRNA  Anesthesia Plan Comments:        Anesthesia Quick Evaluation

## 2022-07-19 NOTE — H&P (Signed)
Chelsea Snyder is an 42 y.o. female. She was seen in our office in May, having irregular menses, twice a month for 10-12 days for the last two years, very painful with clots. Not on BC. She had iron infusions 4 months ago.  Pelvic ultrasound in May with one 9.8 cm myoma.  All medical and surgical options have been discussed, she wants definitive surgical treatment.  Pertinent Gynecological History: Last mammogram: abnormal: normal diagnostic follow-up  Date: 06/2022 Last pap: normal Date: 06/2022 OB History: G2, P2002 LTCS x 2   Menstrual History: Patient's last menstrual period was 06/15/2022 (exact date).    Past Medical History:  Diagnosis Date   Anemia    Follows w/ [facility]. s/p multiple iron infusions   Hyperlipidemia    slightly elevated   Migraine    Follows w/ PCP.   Trigger finger of right hand 2024   s/p injection    Past Surgical History:  Procedure Laterality Date   CESAREAN SECTION     x 2, 2001 & 2007   WISDOM TOOTH EXTRACTION      Family History  Problem Relation Age of Onset   Cancer Mother        lung   Stroke Father     Social History:  reports that she quit smoking about 24 years ago. Her smoking use included cigarettes. She has a 2.00 pack-year smoking history. She has never used smokeless tobacco. She reports current alcohol use. She reports that she does not use drugs.  Allergies:  Allergies  Allergen Reactions   Albuterol Itching   Keflex [Cephalexin] Rash    No medications prior to admission.    Review of Systems  Respiratory: Negative.    Cardiovascular: Negative.     Height 5\' 5"  (1.651 m), weight 99.8 kg, last menstrual period 06/15/2022. Physical Exam Constitutional:      Appearance: Normal appearance.  Cardiovascular:     Rate and Rhythm: Normal rate and regular rhythm.     Heart sounds: Normal heart sounds. No murmur heard. Pulmonary:     Effort: Pulmonary effort is normal. No respiratory distress.      Breath sounds: Normal breath sounds. No wheezing.  Abdominal:     General: There is no distension.     Palpations: Abdomen is soft. There is no mass.     Tenderness: There is no abdominal tenderness.  Genitourinary:    General: Normal vulva.     Comments: Uterus slightly enlarged and irregular No adnexal mass Musculoskeletal:     Cervical back: Normal range of motion and neck supple.  Neurological:     Mental Status: She is alert.     No results found for this or any previous visit (from the past 24 hour(s)).  No results found.  Assessment/Plan: Symptomatic myomatous uterus with menometrorrhagia, one large almost 10 cm myoma.  All medical and surgical options discussed, she wants hysterectomy.  Discussed surgical routes, risks, questions answered.  Will admit for [facility], bilateral salpingectomy.  Leighton Roach Arianie Couse 07/19/2022, 7:59 PM

## 2022-07-20 ENCOUNTER — Ambulatory Visit (HOSPITAL_BASED_OUTPATIENT_CLINIC_OR_DEPARTMENT_OTHER): Payer: 59 | Admitting: Anesthesiology

## 2022-07-20 ENCOUNTER — Encounter (HOSPITAL_BASED_OUTPATIENT_CLINIC_OR_DEPARTMENT_OTHER)
Admission: RE | Disposition: A | Discharge status: Home / Self Care | Payer: Self-pay | Source: Home / Self Care | Attending: Obstetrics and Gynecology

## 2022-07-20 ENCOUNTER — Ambulatory Visit (HOSPITAL_BASED_OUTPATIENT_CLINIC_OR_DEPARTMENT_OTHER): Payer: Self-pay | Admitting: Anesthesiology

## 2022-07-20 ENCOUNTER — Other Ambulatory Visit: Payer: Self-pay

## 2022-07-20 ENCOUNTER — Encounter (HOSPITAL_BASED_OUTPATIENT_CLINIC_OR_DEPARTMENT_OTHER): Payer: Self-pay | Admitting: Obstetrics and Gynecology

## 2022-07-20 ENCOUNTER — Observation Stay (HOSPITAL_BASED_OUTPATIENT_CLINIC_OR_DEPARTMENT_OTHER)
Admission: RE | Admit: 2022-07-20 | Discharge: 2022-07-20 | Disposition: A | Discharge status: Home / Self Care | Payer: 59 | Attending: Obstetrics and Gynecology | Admitting: Obstetrics and Gynecology

## 2022-07-20 DIAGNOSIS — N921 Excessive and frequent menstruation with irregular cycle: Principal | ICD-10-CM | POA: Insufficient documentation

## 2022-07-20 DIAGNOSIS — D259 Leiomyoma of uterus, unspecified: Secondary | ICD-10-CM | POA: Insufficient documentation

## 2022-07-20 DIAGNOSIS — Z87891 Personal history of nicotine dependence: Secondary | ICD-10-CM | POA: Insufficient documentation

## 2022-07-20 DIAGNOSIS — Z01818 Encounter for other preprocedural examination: Secondary | ICD-10-CM

## 2022-07-20 DIAGNOSIS — D251 Intramural leiomyoma of uterus: Secondary | ICD-10-CM

## 2022-07-20 DIAGNOSIS — Z90711 Acquired absence of uterus with remaining cervical stump: Secondary | ICD-10-CM | POA: Diagnosis present

## 2022-07-20 HISTORY — PX: LAPAROSCOPIC SUPRACERVICAL HYSTERECTOMY: SHX5399

## 2022-07-20 HISTORY — DX: Anemia, unspecified: D64.9

## 2022-07-20 HISTORY — DX: Trigger finger, unspecified finger: M65.30

## 2022-07-20 LAB — CBC
HCT: 32.1 % — ABNORMAL LOW (ref 36.0–46.0)
Hemoglobin: 10 g/dL — ABNORMAL LOW (ref 12.0–15.0)
MCH: 26.8 pg (ref 26.0–34.0)
MCHC: 31.2 g/dL (ref 30.0–36.0)
MCV: 86.1 fL (ref 80.0–100.0)
Platelets: 231 10*3/uL (ref 150–400)
RBC: 3.73 MIL/uL — ABNORMAL LOW (ref 3.87–5.11)
RDW: 12.6 % (ref 11.5–15.5)
WBC: 13.9 10*3/uL — ABNORMAL HIGH (ref 4.0–10.5)
nRBC: 0 % (ref 0.0–0.2)

## 2022-07-20 LAB — POCT PREGNANCY, URINE: Preg Test, Ur: NEGATIVE

## 2022-07-20 LAB — TYPE AND SCREEN: Antibody Screen: NEGATIVE

## 2022-07-20 SURGERY — HYSTERECTOMY, SUPRACERVICAL, LAPAROSCOPIC
Anesthesia: General | Site: Abdomen | Laterality: Bilateral

## 2022-07-20 MED ORDER — MEPERIDINE HCL 25 MG/ML IJ SOLN: 6.2500 mg | INTRAMUSCULAR | Status: DC | PRN

## 2022-07-20 MED ORDER — POVIDONE-IODINE 10 % EX SWAB: 2.0000 | Freq: Once | CUTANEOUS | Status: DC

## 2022-07-20 MED ORDER — PHENYLEPHRINE HCL (PRESSORS) 10 MG/ML IV SOLN
INTRAVENOUS | Status: DC | PRN
  Administered 2022-07-20 (×3): 80 ug via INTRAVENOUS

## 2022-07-20 MED ORDER — GABAPENTIN 300 MG PO CAPS: 300.0000 mg | ORAL_CAPSULE | Freq: Three times a day (TID) | ORAL | 0 refills | Status: DC

## 2022-07-20 MED ORDER — FENTANYL CITRATE (PF) 100 MCG/2ML IJ SOLN
INTRAMUSCULAR | Status: AC
  Filled 2022-07-20: qty 2

## 2022-07-20 MED ORDER — SCOPOLAMINE 1 MG/3DAYS TD PT72: 1.0000 | MEDICATED_PATCH | TRANSDERMAL | Status: DC

## 2022-07-20 MED ORDER — ONDANSETRON HCL 4 MG/2ML IJ SOLN
INTRAMUSCULAR | Status: AC
  Filled 2022-07-20: qty 2

## 2022-07-20 MED ORDER — GABAPENTIN 300 MG PO CAPS
300.0000 mg | ORAL_CAPSULE | Freq: Once | ORAL | Status: AC
  Administered 2022-07-20: 300 mg via ORAL

## 2022-07-20 MED ORDER — OXYCODONE HCL 5 MG PO TABS
5.0000 mg | ORAL_TABLET | Freq: Once | ORAL | Status: AC | PRN
  Administered 2022-07-20: 5 mg via ORAL

## 2022-07-20 MED ORDER — PHENYLEPHRINE 80 MCG/ML (10ML) SYRINGE FOR IV PUSH (FOR BLOOD PRESSURE SUPPORT)
PREFILLED_SYRINGE | INTRAVENOUS | Status: AC
  Filled 2022-07-20: qty 10

## 2022-07-20 MED ORDER — ONDANSETRON HCL 4 MG PO TABS
ORAL_TABLET | ORAL | Status: AC
  Filled 2022-07-20: qty 1

## 2022-07-20 MED ORDER — ALBUMIN HUMAN 5 % IV SOLN
INTRAVENOUS | Status: AC
  Filled 2022-07-20: qty 250

## 2022-07-20 MED ORDER — 0.9 % SODIUM CHLORIDE (POUR BTL) OPTIME
TOPICAL | Status: DC | PRN
  Administered 2022-07-20: 500 mL

## 2022-07-20 MED ORDER — HEMOSTATIC AGENTS (NO CHARGE) OPTIME
TOPICAL | Status: DC | PRN
  Administered 2022-07-20: 1

## 2022-07-20 MED ORDER — DEXAMETHASONE SODIUM PHOSPHATE 4 MG/ML IJ SOLN
INTRAMUSCULAR | Status: DC | PRN
  Administered 2022-07-20: 5 mg via INTRAVENOUS

## 2022-07-20 MED ORDER — LACTATED RINGERS IV SOLN: INTRAVENOUS | Status: DC

## 2022-07-20 MED ORDER — LIDOCAINE HCL (CARDIAC) PF 100 MG/5ML IV SOSY
PREFILLED_SYRINGE | INTRAVENOUS | Status: DC | PRN
  Administered 2022-07-20: 100 mg via INTRAVENOUS

## 2022-07-20 MED ORDER — SCOPOLAMINE 1 MG/3DAYS TD PT72
MEDICATED_PATCH | TRANSDERMAL | Status: AC
  Filled 2022-07-20: qty 1

## 2022-07-20 MED ORDER — GABAPENTIN 300 MG PO CAPS
ORAL_CAPSULE | ORAL | Status: AC
  Filled 2022-07-20: qty 1

## 2022-07-20 MED ORDER — SUGAMMADEX SODIUM 200 MG/2ML IV SOLN
INTRAVENOUS | Status: DC | PRN
  Administered 2022-07-20: 200 mg via INTRAVENOUS

## 2022-07-20 MED ORDER — CEFAZOLIN SODIUM-DEXTROSE 2-4 GM/100ML-% IV SOLN
2.0000 g | INTRAVENOUS | Status: AC
  Administered 2022-07-20: 2 g via INTRAVENOUS

## 2022-07-20 MED ORDER — SODIUM CHLORIDE 0.9 % IR SOLN
Status: DC | PRN
  Administered 2022-07-20: 1000 mL

## 2022-07-20 MED ORDER — GABAPENTIN 300 MG PO CAPS: 300.0000 mg | ORAL_CAPSULE | Freq: Three times a day (TID) | ORAL | Status: DC

## 2022-07-20 MED ORDER — PROMETHAZINE HCL 25 MG/ML IJ SOLN: 6.2500 mg | INTRAMUSCULAR | Status: DC | PRN

## 2022-07-20 MED ORDER — KETOROLAC TROMETHAMINE 30 MG/ML IJ SOLN
INTRAMUSCULAR | Status: AC
  Filled 2022-07-20: qty 1

## 2022-07-20 MED ORDER — MIDAZOLAM HCL 2 MG/2ML IJ SOLN
INTRAMUSCULAR | Status: AC
  Filled 2022-07-20: qty 2

## 2022-07-20 MED ORDER — ACETAMINOPHEN 500 MG PO TABS
1000.0000 mg | ORAL_TABLET | Freq: Four times a day (QID) | ORAL | Status: DC
  Administered 2022-07-20: 1000 mg via ORAL

## 2022-07-20 MED ORDER — IBUPROFEN 200 MG PO TABS: 600.0000 mg | ORAL_TABLET | Freq: Four times a day (QID) | ORAL | Status: DC

## 2022-07-20 MED ORDER — SCOPOLAMINE 1 MG/3DAYS TD PT72
1.0000 | MEDICATED_PATCH | TRANSDERMAL | Status: DC
  Administered 2022-07-20: 1.5 mg via TRANSDERMAL

## 2022-07-20 MED ORDER — AMISULPRIDE (ANTIEMETIC) 5 MG/2ML IV SOLN: 10.0000 mg | Freq: Once | INTRAVENOUS | Status: DC | PRN

## 2022-07-20 MED ORDER — MIDAZOLAM HCL 5 MG/5ML IJ SOLN
INTRAMUSCULAR | Status: DC | PRN
  Administered 2022-07-20: 2 mg via INTRAVENOUS

## 2022-07-20 MED ORDER — OXYCODONE HCL 5 MG/5ML PO SOLN: 5.0000 mg | Freq: Once | ORAL | Status: AC | PRN

## 2022-07-20 MED ORDER — HYDROMORPHONE HCL 1 MG/ML IJ SOLN: 1.0000 mg | INTRAMUSCULAR | Status: DC | PRN

## 2022-07-20 MED ORDER — FENTANYL CITRATE (PF) 100 MCG/2ML IJ SOLN
INTRAMUSCULAR | Status: DC | PRN
  Administered 2022-07-20 (×6): 50 ug via INTRAVENOUS

## 2022-07-20 MED ORDER — OXYCODONE HCL 5 MG PO TABS
ORAL_TABLET | ORAL | Status: AC
  Filled 2022-07-20: qty 2

## 2022-07-20 MED ORDER — ONDANSETRON HCL 4 MG/2ML IJ SOLN
INTRAMUSCULAR | Status: DC | PRN
  Administered 2022-07-20: 4 mg via INTRAVENOUS

## 2022-07-20 MED ORDER — ACETAMINOPHEN 500 MG PO TABS
1000.0000 mg | ORAL_TABLET | Freq: Once | ORAL | Status: AC
  Administered 2022-07-20: 1000 mg via ORAL

## 2022-07-20 MED ORDER — DEXAMETHASONE SODIUM PHOSPHATE 10 MG/ML IJ SOLN
INTRAMUSCULAR | Status: AC
  Filled 2022-07-20: qty 1

## 2022-07-20 MED ORDER — ROCURONIUM BROMIDE 10 MG/ML (PF) SYRINGE
PREFILLED_SYRINGE | INTRAVENOUS | Status: AC
  Filled 2022-07-20: qty 10

## 2022-07-20 MED ORDER — GLYCOPYRROLATE PF 0.2 MG/ML IJ SOSY
PREFILLED_SYRINGE | INTRAMUSCULAR | Status: AC
  Filled 2022-07-20: qty 1

## 2022-07-20 MED ORDER — ONDANSETRON HCL 4 MG/2ML IJ SOLN: 4.0000 mg | Freq: Four times a day (QID) | INTRAMUSCULAR | Status: DC | PRN

## 2022-07-20 MED ORDER — KETOROLAC TROMETHAMINE 30 MG/ML IJ SOLN: 30.0000 mg | Freq: Four times a day (QID) | INTRAMUSCULAR | Status: DC

## 2022-07-20 MED ORDER — BUPIVACAINE HCL (PF) 0.25 % IJ SOLN
INTRAMUSCULAR | Status: DC | PRN
  Administered 2022-07-20: 18 mL

## 2022-07-20 MED ORDER — HYDROMORPHONE HCL 1 MG/ML IJ SOLN: 0.2500 mg | INTRAMUSCULAR | Status: DC | PRN

## 2022-07-20 MED ORDER — DEXTROSE-SODIUM CHLORIDE 5-0.45 % IV SOLN: INTRAVENOUS | Status: DC

## 2022-07-20 MED ORDER — BISACODYL 10 MG RE SUPP: 10.0000 mg | Freq: Every day | RECTAL | Status: DC | PRN

## 2022-07-20 MED ORDER — GLYCOPYRROLATE 0.2 MG/ML IJ SOLN
INTRAMUSCULAR | Status: DC | PRN
  Administered 2022-07-20: .1 mg via INTRAVENOUS

## 2022-07-20 MED ORDER — OXYCODONE HCL 5 MG PO TABS: 5.0000 mg | ORAL_TABLET | ORAL | Status: DC | PRN

## 2022-07-20 MED ORDER — ONDANSETRON HCL 4 MG PO TABS
4.0000 mg | ORAL_TABLET | Freq: Four times a day (QID) | ORAL | Status: DC | PRN
  Administered 2022-07-20: 4 mg via ORAL

## 2022-07-20 MED ORDER — SIMETHICONE 80 MG PO CHEW: 80.0000 mg | CHEWABLE_TABLET | Freq: Four times a day (QID) | ORAL | Status: DC | PRN

## 2022-07-20 MED ORDER — ACETAMINOPHEN 500 MG PO TABS
ORAL_TABLET | ORAL | Status: AC
  Filled 2022-07-20: qty 2

## 2022-07-20 MED ORDER — ROCURONIUM BROMIDE 100 MG/10ML IV SOLN
INTRAVENOUS | Status: DC | PRN
  Administered 2022-07-20: 60 mg via INTRAVENOUS
  Administered 2022-07-20 (×2): 10 mg via INTRAVENOUS
  Administered 2022-07-20: 20 mg via INTRAVENOUS
  Administered 2022-07-20: 10 mg via INTRAVENOUS
  Administered 2022-07-20 (×2): 20 mg via INTRAVENOUS

## 2022-07-20 MED ORDER — ALUM & MAG HYDROXIDE-SIMETH 200-200-20 MG/5ML PO SUSP: 30.0000 mL | ORAL | Status: DC | PRN

## 2022-07-20 MED ORDER — ALBUMIN HUMAN 5 % IV SOLN: INTRAVENOUS | Status: DC | PRN

## 2022-07-20 MED ORDER — PROPOFOL 10 MG/ML IV BOLUS
INTRAVENOUS | Status: AC
  Filled 2022-07-20: qty 20

## 2022-07-20 MED ORDER — ACETAMINOPHEN 500 MG PO TABS: 1000.0000 mg | ORAL_TABLET | ORAL | Status: DC

## 2022-07-20 MED ORDER — OXYCODONE HCL 5 MG PO TABS: 5.0000 mg | ORAL_TABLET | ORAL | 0 refills | Status: DC | PRN

## 2022-07-20 MED ORDER — KETOROLAC TROMETHAMINE 30 MG/ML IJ SOLN: 30.0000 mg | Freq: Once | INTRAMUSCULAR | Status: DC | PRN

## 2022-07-20 MED ORDER — LIDOCAINE HCL (PF) 2 % IJ SOLN
INTRAMUSCULAR | Status: AC
  Filled 2022-07-20: qty 5

## 2022-07-20 MED ORDER — CEFAZOLIN SODIUM-DEXTROSE 2-4 GM/100ML-% IV SOLN
INTRAVENOUS | Status: AC
  Filled 2022-07-20: qty 100

## 2022-07-20 MED ORDER — GABAPENTIN 300 MG PO CAPS: 300.0000 mg | ORAL_CAPSULE | ORAL | Status: DC

## 2022-07-20 MED ORDER — MENTHOL 3 MG MT LOZG: 1.0000 | LOZENGE | OROMUCOSAL | Status: DC | PRN

## 2022-07-20 MED ORDER — PROPOFOL 10 MG/ML IV BOLUS
INTRAVENOUS | Status: DC | PRN
  Administered 2022-07-20: 200 mg via INTRAVENOUS

## 2022-07-20 MED ORDER — KETOROLAC TROMETHAMINE 30 MG/ML IJ SOLN
INTRAMUSCULAR | Status: DC | PRN
  Administered 2022-07-20: 30 mg via INTRAVENOUS

## 2022-07-20 MED ORDER — IBUPROFEN 600 MG PO TABS: 600.0000 mg | ORAL_TABLET | Freq: Four times a day (QID) | ORAL | 0 refills | Status: DC

## 2022-07-20 SURGICAL SUPPLY — 49 items
ADH SKN CLS APL DERMABOND .7 (GAUZE/BANDAGES/DRESSINGS) ×1
APL SWBSTK 6 STRL LF DISP (MISCELLANEOUS) ×2
APPLICATOR COTTON TIP 6 STRL (MISCELLANEOUS) IMPLANT
APPLICATOR COTTON TIP 6IN STRL (MISCELLANEOUS) ×2
BARRIER ADHS 3X4 INTERCEED (GAUZE/BANDAGES/DRESSINGS) IMPLANT
BRR ADH 4X3 ABS CNTRL BYND (GAUZE/BANDAGES/DRESSINGS) ×1
CELL SAVER LIPIGURD (MISCELLANEOUS) ×1 IMPLANT
COVER MAYO STAND STRL (DRAPES) ×2 IMPLANT
DERMABOND ADVANCED .7 DNX12 (GAUZE/BANDAGES/DRESSINGS) ×2 IMPLANT
DEVICE RETRIEVAL ALEXIS 14 (MISCELLANEOUS) IMPLANT
DRAPE SURG IRRIG POUCH 19X23 (DRAPES) ×2 IMPLANT
DURAPREP 26ML APPLICATOR (WOUND CARE) ×2 IMPLANT
EXTRT SYSTEM ALEXIS 14CM (MISCELLANEOUS) ×1
GAUZE 4X4 16PLY ~~LOC~~+RFID DBL (SPONGE) ×4 IMPLANT
GLOVE BIO SURGEON STRL SZ7 (GLOVE) IMPLANT
GLOVE BIO SURGEON STRL SZ8 (GLOVE) ×2 IMPLANT
GLOVE BIOGEL PI IND STRL 7.0 (GLOVE) ×4 IMPLANT
GLOVE ECLIPSE 6.5 STRL STRAW (GLOVE) IMPLANT
GLOVE ORTHO TXT STRL SZ7.5 (GLOVE) ×2 IMPLANT
GOWN STRL REUS W/TWL LRG LVL3 (GOWN DISPOSABLE) ×4 IMPLANT
GOWN STRL REUS W/TWL XL LVL3 (GOWN DISPOSABLE) IMPLANT
HIBICLENS CHG 4% 4OZ BTL (MISCELLANEOUS) IMPLANT
IRRIG SUCT STRYKERFLOW 2 WTIP (MISCELLANEOUS) ×1
IRRIGATION SUCT STRKRFLW 2 WTP (MISCELLANEOUS) ×2 IMPLANT
IV NS 1000ML (IV SOLUTION) ×1
IV NS 1000ML BAXH (IV SOLUTION) IMPLANT
KIT PINK PAD W/HEAD ARE REST (MISCELLANEOUS) ×1
KIT PINK PAD W/HEAD ARM REST (MISCELLANEOUS) ×2 IMPLANT
KIT TURNOVER CYSTO (KITS) ×2 IMPLANT
NS IRRIG 500ML POUR BTL (IV SOLUTION) IMPLANT
PACK LAPAROSCOPY BASIN (CUSTOM PROCEDURE TRAY) ×2 IMPLANT
PAD OB MATERNITY 4.3X12.25 (PERSONAL CARE ITEMS) IMPLANT
PROTECTOR NERVE ULNAR (MISCELLANEOUS) ×4 IMPLANT
SET TUBE SMOKE EVAC HIGH FLOW (TUBING) IMPLANT
SHEARS HARMONIC ACE PLUS 36CM (ENDOMECHANICALS) IMPLANT
SLEEVE SCD COMPRESS KNEE MED (STOCKING) ×2 IMPLANT
SOL ANTI FOG 6CC (MISCELLANEOUS) IMPLANT
SPIKE FLUID TRANSFER (MISCELLANEOUS) IMPLANT
SPONGE T-LAP 18X18 ~~LOC~~+RFID (SPONGE) IMPLANT
SUT VIC AB 3-0 PS2 18 (SUTURE) ×2
SUT VIC AB 3-0 PS2 18XBRD (SUTURE) ×2 IMPLANT
SUT VICRYL 0 UR6 27IN ABS (SUTURE) ×2 IMPLANT
SYR 20ML LL LF (SYRINGE) IMPLANT
SYR CONTROL 10ML LL (SYRINGE) IMPLANT
TOWEL OR 17X24 6PK STRL BLUE (TOWEL DISPOSABLE) ×4 IMPLANT
TRAY FOLEY W/BAG SLVR 14FR LF (SET/KITS/TRAYS/PACK) ×2 IMPLANT
TROCAR BALLN 12MMX100 BLUNT (TROCAR) ×2 IMPLANT
TROCAR Z-THREAD FIOS 5X100MM (TROCAR) ×4 IMPLANT
WARMER LAPAROSCOPE (MISCELLANEOUS) ×2 IMPLANT

## 2022-07-20 NOTE — Op Note (Signed)
Preoperative diagnosis: Menometrorrhagia, uterine leiomyoma Postoperative diagnosis: Same Procedure: Laparoscopic supracervical hysterectomy, bilateral salpingectomy, extensive adhesiolysis Surgeon: Lavina Hamman M.D. Assistant: Ellison Hughs, MD Anesthesia: Gen. Endotracheal tube Findings: Enlarged uterus with large myoma stuck to anterior abdominal wall.  Omentum adherent to anterior abdominal wall and the uterine fundus.  Normal tubes and ovaries.  Assistance from Dr. Reina Fuse was necessary for retraction and to take down the pedicles on her side Specimens: Morcellated uterus for routine pathology Estimated blood loss: 600 cc Complications: None   Procedure in detail: The patient was taken to the operating room and placed in the dorsosupine position. General anesthesia was induced. Arms were tucked to her sides and legs were placed in mobile stirrups. Abdomen perineum and vagina were then prepped and draped in usual sterile fashion and a Foley catheter was inserted. Infraumbilical skin was infiltrated with quarter percent Marcaine and a 3 cm horizontal incision was made.  The fascia was elevated and entered sharply with scissors.  Peritoneum was then entered bluntly.  A pursestring suture of 0 Vicryl was then placed around the fascial incision.  The Hassan cannula with a balloon was then inserted and the abdomen was insufflated.  The laparoscope was inserted and confirmed good placement.  A 5 mm port was then placed on each side under direct visualization. Inspection revealed the above-mentioned findings.  Harmonic scalpel was used to very carefully take down the omentum from the anterior abdominal wall and the uterine fundus.  This took extensive dissection but eventually the omentum was freed.  Then it was noticed that the anterior portion of the uterus was adherent to the anterior abdominal wall.  Harmonic scalpel was also used to take this down until a good plane was identified and the anterior  uterus was freed from the anterior abdominal wall.  The distal right fallopian tube was grasped and elevated.  Harmonic scalpel was used to remove the tube from the ovary and take down the mesosalpinx to the uterus.  The right uterine cornu was grasped with a single-tooth tenaculum from the left side. The Harmonic scalpel Ace was used to take down the right round ligament, utero-ovarian pedicle and broad ligament. The anterior peritoneum was incised across the anterior portion of the uterus to help release the bladder. Uterine artery artery was skeletonized and taken down with the harmonic scalpel Ace with adequate division and adequate hemostasis. A similar procedure was then performed on the patient's left side taking down the mesosalpinx to free the tube, round ligament, utero-ovarian pedicle, and broad ligament. Anterior peritoneum was incised across the anterior portion the uterus to meet the incision coming from the patient's right side. Uterine artery was skeletonized and taken down with the Harmonic Scalpel with adequate division and adequate hemostasis. I then began to remove the uterus from the cervix using a drill, clamp, cut technique on maximum power, using the Harmonic scalpel Ace. This was done about halfway on the left side and then halfway on the right side removing the uterus from the cervix. The cervical stump appeared to be hemostatic.  The umbilical trocar was removed and an Alexis bag was inserted through the incision into the pelvis.  The umbilical trocar was reintroduced and the abdomen was reinsufflated.  I was then able to position the bag in the pelvis and place the uterus in the bag.  The umbilical trocar was again removed.  The edge of the bag was grasped and delivered through the incision.   The bag was then brought through  the incision so that we could morcellate the uterus.  The skin guard was placed to protect the abdominal incision.  I was then able to use a scalpel remove the  uterus in several pieces in the bag.  The bag was removed and the umbilical trocar was replaced.  Pelvis was copiously irrigated. Small amount of bleeding from the cervical stump was controlled with the harmonic scalpel Ace. A piece of Interceed was placed over the cervical stump. At this point all pedicles appeared to be hemostatic and there was no other pathology noted.  The 5 mm ports were removed under direct visualization all gas was allowed to deflate from the abdomen.  The umbilical trocar was removed.  The previously placed pursestring suture was tied and this achieved good fascial closure.  Several throws of an additional 0 Vicryl suture was placed to make sure the fascia was reapproximated.  Skin incisions were closed with interrupted subcuticular sutures of 4-0 Vicryl followed by Dermabond. The patient tolerated the procedure well. She was taken to the recovery in stable condition. Counts were correct x2, she received Ancef 2 g IV the beginning of the procedure and had PAS hose on throughout the procedure. Foley catheter was removed

## 2022-07-20 NOTE — Transfer of Care (Signed)
Immediate Anesthesia Transfer of Care Note  Patient: Chelsea Snyder  Procedure(s) Performed: Procedure(s) (LRB): LAPAROSCOPIC SUPRACERVICAL HYSTERECTOMY AND BILATERAL SALPINGECTOMY (Bilateral)  Patient Location: PACU  Anesthesia Type: General  Level of Consciousness: awake, sedated, patient cooperative and responds to stimulation  Airway & Oxygen Therapy: Patient Spontanous Breathing and Patient connected to [place] oxygen  Post-op Assessment: Report given to PACU RN, Post -op Vital signs reviewed and stable and Patient moving all extremities  Post vital signs: Reviewed and stable  Complications: No apparent anesthesia complications

## 2022-07-20 NOTE — Interval H&P Note (Signed)
History and Physical Interval Note:  07/20/2022 8:43 AM  Chelsea Snyder  has presented today for surgery, with the diagnosis of leiomyoma of uterus.  The various methods of treatment have been discussed with the patient and family. After consideration of risks, benefits and other options for treatment, the patient has consented to  Procedure(s): LAPAROSCOPIC SUPRACERVICAL HYSTERECTOMY AND BILATERAL SALPINGECTOMY (Bilateral) as a surgical intervention.  The patient's history has been reviewed, patient examined, no change in status, stable for surgery.  I have reviewed the patient's chart and labs.  Questions were answered to the patient's satisfaction.     Leighton Roach Decarlo Rivet

## 2022-07-20 NOTE — Discharge Instructions (Signed)
Routine instructions for laparoscopic hysterectomy 

## 2022-07-20 NOTE — Anesthesia Procedure Notes (Signed)
Procedure Name: Intubation Date/Time: 07/20/2022 9:06 AM  Performed by: Jessica Priest, CRNAPre-anesthesia Checklist: Patient identified, Emergency Drugs available, Suction available, Patient being monitored and Timeout performed Patient Re-evaluated:Patient Re-evaluated prior to induction Oxygen Delivery Method: Circle system utilized Preoxygenation: Pre-oxygenation with 100% oxygen Induction Type: IV induction Ventilation: Mask ventilation without difficulty Laryngoscope Size: Mac and 3 Grade View: Grade II Tube type: Oral Tube size: 7.0 mm Number of attempts: 1 Airway Equipment and Method: Stylet and Oral airway Placement Confirmation: ETT inserted through vocal cords under direct vision, positive ETCO2, breath sounds checked- equal and bilateral and CO2 detector Secured at: 22 cm Tube secured with: Tape Dental Injury: Teeth and Oropharynx as per pre-operative assessment

## 2022-07-20 NOTE — Progress Notes (Signed)
Post-op check, s/p LSH with extensive adhesiolysis  Feels ok, mild pain, no recent nausea.  Tol food, voiding, has been lightheaded when out of bed Afeb, VSS Abd- soft, mild/appropriate tenderness, incisions intact  Will check CBC.  She wants to go home if possible-if CBC stable and able to ambulate will d/c home, otherwise keep overnight and plan for discharge in am

## 2022-07-21 ENCOUNTER — Encounter (HOSPITAL_BASED_OUTPATIENT_CLINIC_OR_DEPARTMENT_OTHER): Payer: Self-pay | Admitting: Obstetrics and Gynecology

## 2022-07-21 LAB — SURGICAL PATHOLOGY

## 2022-07-21 NOTE — Anesthesia Postprocedure Evaluation (Signed)
Anesthesia Post Note  Patient: Chelsea Snyder  Procedure(s) Performed: LAPAROSCOPIC SUPRACERVICAL HYSTERECTOMY AND BILATERAL SALPINGECTOMY (Bilateral: Abdomen)     Patient location during evaluation: PACU Anesthesia Type: General Level of consciousness: sedated and patient cooperative Pain management: pain level controlled Vital Signs Assessment: post-procedure vital signs reviewed and stable Respiratory status: spontaneous breathing Cardiovascular status: stable Anesthetic complications: no   No notable events documented.  Last Vitals:  Vitals:   07/20/22 1443 07/20/22 1859  BP: 132/78 118/70  Pulse: 82   Resp: 14 14  Temp:  36.5 C  SpO2: 99% 97%    Last Pain:  Vitals:   07/20/22 1859  TempSrc: Oral  PainSc:                  Lewie Loron

## 2022-07-22 NOTE — Discharge Summary (Signed)
Physician Discharge Summary  Patient ID: Chelsea Snyder MRN: [id number] DOB/AGE: Jun 04, 1980 42 y.o.  Admit date: 07/20/2022 Discharge date: 07/20/2022  Admission Diagnoses:  Symptomatic myomatous uterus  Discharge Diagnoses: Same with significant pelvic adhesions Principal Problem:   Uterine leiomyoma Active Problems:   S/P laparoscopic supracervical hysterectomy   Discharged Condition: good  Hospital Course: Pt admitted and underwent LSH, bilateral salpingectomy, extensive adhesiolysis, EBL 600cc.  She did well post-op, met all milestones, stable for discharge evening of surgery    Discharge Exam: Blood pressure 118/70, pulse 82, temperature 97.7 F (36.5 C), temperature source Oral, resp. rate 14, height 5\' 5"  (1.651 m), weight 106.7 kg, last menstrual period 07/09/2022, SpO2 97%. General appearance: alert  Disposition: Discharge disposition: 01-Home or Self Care       Discharge Instructions     Call MD for:  difficulty breathing, headache or visual disturbances   Complete by: As directed    Call MD for:  persistant dizziness or light-headedness   Complete by: As directed    Call MD for:  persistant nausea and vomiting   Complete by: As directed    Call MD for:  redness, tenderness, or signs of infection (pain, swelling, redness, odor or green/yellow discharge around incision site)   Complete by: As directed    Call MD for:  severe uncontrolled pain   Complete by: As directed    Call MD for:  temperature >100.4   Complete by: As directed    Diet - low sodium heart healthy   Complete by: As directed    Increase activity slowly   Complete by: As directed    Lifting restrictions   Complete by: As directed    10 lbs      Allergies as of 07/20/2022       Reactions   Albuterol Itching   Keflex [cephalexin] Rash        Medication List     TAKE these medications    gabapentin 300 MG capsule Commonly known as: NEURONTIN Take 1 capsule (300 mg total) by  mouth 3 (three) times daily.   IBUPROFEN & ACETAMINOPHEN PO Take by mouth daily as needed. Takes for migraines and migraines.   ibuprofen 600 MG tablet Commonly known as: ADVIL Take 1 tablet (600 mg total) by mouth every 6 (six) hours.   IRON PO Take by mouth daily.   oxyCODONE 5 MG immediate release tablet Commonly known as: Oxy IR/ROXICODONE Take 1 tablet (5 mg total) by mouth every 4 (four) hours as needed for severe pain.   rizatriptan 10 MG tablet Commonly known as: MAXALT Take 1 tablet (10 mg total) by mouth as needed for migraine. May repeat in 2 hours if needed   traZODone 100 MG tablet Commonly known as: DESYREL Take 1 tablet (100 mg total) by mouth at bedtime.        Follow-up Information     Kesean Serviss, MD. Schedule an appointment as soon as possible for a visit in 2 week(s).   Specialty: Obstetrics and Gynecology Contact information: [address], [address] [place] [address] [phone]                 Signed: Leighton Roach Croix Presley 07/22/2022, 8:38 AM

## 2022-08-16 ENCOUNTER — Ambulatory Visit (INDEPENDENT_AMBULATORY_CARE_PROVIDER_SITE_OTHER): Payer: 59 | Admitting: Family Medicine

## 2022-08-16 ENCOUNTER — Encounter: Payer: Self-pay | Admitting: Family Medicine

## 2022-08-16 VITALS — BP 101/62 | HR 69 | Temp 97.7°F | Ht 65.0 in | Wt 237.2 lb

## 2022-08-16 DIAGNOSIS — E7841 Elevated Lipoprotein(a): Secondary | ICD-10-CM | POA: Diagnosis not present

## 2022-08-16 DIAGNOSIS — D500 Iron deficiency anemia secondary to blood loss (chronic): Secondary | ICD-10-CM | POA: Diagnosis not present

## 2022-08-16 DIAGNOSIS — M65311 Trigger thumb, right thumb: Secondary | ICD-10-CM | POA: Diagnosis not present

## 2022-08-16 DIAGNOSIS — F5101 Primary insomnia: Secondary | ICD-10-CM

## 2022-08-16 DIAGNOSIS — Z0001 Encounter for general adult medical examination with abnormal findings: Secondary | ICD-10-CM

## 2022-08-16 DIAGNOSIS — Z0000 Encounter for general adult medical examination without abnormal findings: Secondary | ICD-10-CM

## 2022-08-16 MED ORDER — SEMAGLUTIDE-WEIGHT MANAGEMENT 2.4 MG/0.75ML ~~LOC~~ SOAJ
2.4000 mg | SUBCUTANEOUS | 6 refills | Status: AC
Start: 2022-12-10 — End: 2023-01-07

## 2022-08-16 MED ORDER — SEMAGLUTIDE-WEIGHT MANAGEMENT 0.5 MG/0.5ML ~~LOC~~ SOAJ
0.5000 mg | SUBCUTANEOUS | 0 refills | Status: AC
Start: 2022-09-14 — End: 2022-10-12

## 2022-08-16 MED ORDER — SEMAGLUTIDE-WEIGHT MANAGEMENT 1.7 MG/0.75ML ~~LOC~~ SOAJ
1.7000 mg | SUBCUTANEOUS | 0 refills | Status: AC
Start: 2022-11-11 — End: 2022-12-09

## 2022-08-16 MED ORDER — SEMAGLUTIDE-WEIGHT MANAGEMENT 0.25 MG/0.5ML ~~LOC~~ SOAJ
0.2500 mg | SUBCUTANEOUS | 0 refills | Status: AC
Start: 2022-08-16 — End: 2022-09-13

## 2022-08-16 MED ORDER — SEMAGLUTIDE-WEIGHT MANAGEMENT 1 MG/0.5ML ~~LOC~~ SOAJ
1.0000 mg | SUBCUTANEOUS | 0 refills | Status: AC
Start: 2022-10-13 — End: 2022-11-10

## 2022-08-16 NOTE — Patient Instructions (Signed)

## 2022-08-16 NOTE — Progress Notes (Signed)
Complete physical exam  Patient: Chelsea Snyder   DOB: 11-13-1980   42 y.o. Female  MRN: [id number]  Subjective:    Chief Complaint  Patient presents with   Annual Exam    Chelsea Snyder is a 42 y.o. female who presents today for a complete physical exam. She reports consuming a fairly well balanced diet. The patient does not participate in regular exercise at present. She generally feels fairly well. She reports sleeping fairly well. She does have additional problems to discuss today.   She is continuing to having difficulties with trigger thumb of her left thumb. She had an injection in May with only short mild improvement. Continues to have difficulty with opening her thumb. Sometimes has to use her other hand to pry it open. Some tenderness in thumb. Occasional tingling at the tip.   She continues to struggle with anemia. Recently had a hysterectomy. Not currently on iron supplement. Reports fatigue, exercise intolerance though this is improving.   Interested in medications for weight loss. Previously did well with saxenda. Has failed phentermine.   Most recent fall risk assessment:    08/16/2022    3:46 PM  Fall Risk   Falls in the past year? 1  Number falls in past yr: 0  Injury with Fall? 0  Risk for fall due to : History of fall(s)  Follow up Falls evaluation completed     Most recent depression screenings:    08/16/2022    3:47 PM 05/21/2022    9:56 AM  PHQ 2/9 Scores  PHQ - 2 Score 0 0  PHQ- 9 Score 2 4        Patient Care Team: Gabriel Earing, FNP as PCP - General (Family Medicine) Doreatha Massed, MD as Medical Oncologist (Hematology)   Outpatient Medications Prior to Visit  Medication Sig   Ferrous Sulfate (IRON PO) Take by mouth daily.   IBUPROFEN & ACETAMINOPHEN PO Take by mouth daily as needed. Takes for migraines and migraines.   rizatriptan (MAXALT) 10 MG tablet Take 1 tablet (10 mg total) by mouth as needed for migraine. May repeat in 2  hours if needed   traZODone (DESYREL) 100 MG tablet Take 1 tablet (100 mg total) by mouth at bedtime.   [DISCONTINUED] gabapentin (NEURONTIN) 300 MG capsule Take 1 capsule (300 mg total) by mouth 3 (three) times daily.   [DISCONTINUED] ibuprofen (ADVIL) 600 MG tablet Take 1 tablet (600 mg total) by mouth every 6 (six) hours.   [DISCONTINUED] oxyCODONE (OXY IR/ROXICODONE) 5 MG immediate release tablet Take 1 tablet (5 mg total) by mouth every 4 (four) hours as needed for severe pain.   No facility-administered medications prior to visit.    ROS Negative unless specially indicated above in HPI.     Objective:     BP 101/62   Pulse 69   Temp 97.7 F (36.5 C) (Temporal)   Ht 5\' 5"  (1.651 m)   Wt 237 lb 4 oz (107.6 kg)   SpO2 96%   BMI 39.48 kg/m    Physical Exam Vitals and nursing note reviewed.  Constitutional:      General: She is not in acute distress.    Appearance: She is obese. She is not ill-appearing, toxic-appearing or diaphoretic.  HENT:     Head: Normocephalic.     Right Ear: Tympanic membrane, ear canal and external ear normal.     Left Ear: Tympanic membrane, ear canal and external ear normal.  Nose: Nose normal.     Mouth/Throat:     Mouth: Mucous membranes are dry.     Pharynx: Oropharynx is clear.  Eyes:     Extraocular Movements: Extraocular movements intact.     Conjunctiva/sclera: Conjunctivae normal.     Pupils: Pupils are equal, round, and reactive to light.  Cardiovascular:     Rate and Rhythm: Normal rate and regular rhythm.     Pulses: Normal pulses.     Heart sounds: Normal heart sounds. No murmur heard.    No friction rub. No gallop.  Pulmonary:     Effort: Pulmonary effort is normal.     Breath sounds: Normal breath sounds.  Abdominal:     General: Bowel sounds are normal. There is no distension.     Palpations: Abdomen is soft. There is no mass.     Tenderness: There is no abdominal tenderness. There is no guarding.  Musculoskeletal:         General: No swelling or tenderness.     Cervical back: Normal range of motion and neck supple. No tenderness.     Right lower leg: No edema.     Left lower leg: No edema.     Comments: Trigger thumb of left hand present. No erythema. Mild tenderness.   Skin:    General: Skin is warm and dry.     Capillary Refill: Capillary refill takes less than 2 seconds.     Findings: No lesion or rash.  Neurological:     General: No focal deficit present.     Mental Status: She is alert and oriented to person, place, and time.     Cranial Nerves: No cranial nerve deficit.     Motor: No weakness.     Gait: Gait normal.  Psychiatric:        Mood and Affect: Mood normal.        Behavior: Behavior normal.        Thought Content: Thought content normal.        Judgment: Judgment normal.      No results found for any visits on 08/16/22.     Assessment & Plan:    Routine Health Maintenance and Physical Exam  Chelsea Snyder was seen today for annual exam.  Diagnoses and all orders for this visit:  Routine general medical examination at a health care facility  Elevated lipoprotein(a) -     Lipid panel  Iron deficiency anemia due to chronic blood loss Underweight hysterectomy last month. Not currently on supplement. Labs pending.  -     Anemia Profile B  Morbid obesity (HCC) Patient's BMI is >30 mg/m2.  Patient's current BMI is Body mass index is 39.48 kg/m.Marland Kitchen  Patient is currently enrolled in a healthy eating plan along with encouraged exercise. She has previously failed phentermine. Did well with saxend prior to it's discontinuation. Patient does not have a personal or family history of medullary thyroid carcinoma (MTC) or Multiple Endocrine Neoplasia syndrome type 2 (MEN 2). -     CMP14+EGFR -     TSH -     Vitamin D, 25-hydroxy -     Bayer DCA Hb A1c Waived -     Semaglutide-Weight Management 0.25 MG/0.5ML SOAJ; Inject 0.25 mg into the skin once a week for 28 days. -      Semaglutide-Weight Management 0.5 MG/0.5ML SOAJ; Inject 0.5 mg into the skin once a week for 28 days. -     Semaglutide-Weight Management 1 MG/0.5ML SOAJ;  Inject 1 mg into the skin once a week for 28 days. -     Semaglutide-Weight Management 1.7 MG/0.75ML SOAJ; Inject 1.7 mg into the skin once a week for 28 days. -     Semaglutide-Weight Management 2.4 MG/0.75ML SOAJ; Inject 2.4 mg into the skin once a week for 28 days.  Primary insomnia Well controlled on current regimen.   Trigger finger of right thumb Minimal improvement with injection. Referral to ortho for further evaluation and management.  -     Ambulatory referral to Orthopedic Surgery    Immunization History  Administered Date(s) Administered   Moderna Sars-Covid-2 Vaccination 05/27/2019, 06/24/2019   Tdap 10/10/2018    Health Maintenance  Topic Date Due   PAP SMEAR-Modifier  08/16/2022 (Originally 12/11/1999)   INFLUENZA VACCINE  04/11/2023 (Originally 08/12/2022)   Hepatitis C Screening  08/16/2023 (Originally 12/10/1996)   HIV Screening  08/16/2023 (Originally 12/10/1993)   COVID-19 Vaccine (3 - 2023-24 season) 09/01/2023 (Originally 09/11/2021)   DTaP/Tdap/Td (2 - Td or Tdap) 10/09/2028   HPV VACCINES  Aged Out    Discussed health benefits of physical activity, and encouraged her to engage in regular exercise appropriate for her age and condition.  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 status migrainosus     Other   Insomnia   Elevated lipoprotein(a)   Relevant Orders   Lipid panel   Iron deficiency anemia   Relevant Orders   Anemia Profile B   Morbid obesity (HCC)   Relevant Medications   Semaglutide-Weight Management 0.25 MG/0.5ML SOAJ   Semaglutide-Weight Management 0.5 MG/0.5ML SOAJ (Start on 09/14/2022)   Semaglutide-Weight Management 1 MG/0.5ML SOAJ (Start on 10/13/2022)   Semaglutide-Weight Management 1.7 MG/0.75ML SOAJ  (Start on 11/11/2022)   Semaglutide-Weight Management 2.4 MG/0.75ML SOAJ (Start on 12/10/2022)   Other Relevant Orders   CMP14+EGFR   TSH   Vitamin D, 25-hydroxy   Bayer DCA Hb A1c Waived   Other Visit Diagnoses     Routine general medical examination at a health care facility    -  Primary   Trigger finger of right thumb       Relevant Orders   Ambulatory referral to Orthopedic Surgery        Return in about 8 weeks (around 10/11/2022) for weight.  The patient indicates understanding of these issues and agrees with the plan.  Gabriel Earing, FNP

## 2022-08-18 ENCOUNTER — Other Ambulatory Visit: Payer: Self-pay | Admitting: Family Medicine

## 2022-08-18 DIAGNOSIS — E559 Vitamin D deficiency, unspecified: Secondary | ICD-10-CM

## 2022-08-18 MED ORDER — VITAMIN D (ERGOCALCIFEROL) 1.25 MG (50000 UNIT) PO CAPS
50000.0000 [IU] | ORAL_CAPSULE | ORAL | 0 refills | Status: DC
Start: 2022-08-18 — End: 2022-12-20

## 2022-08-19 ENCOUNTER — Telehealth: Payer: Self-pay

## 2022-08-19 NOTE — Telephone Encounter (Signed)
Chelsea Snyder ([employer]) Rx #: [id number] AVWUJW 0.25MG /0.5ML auto-injectors Form [employer] Electronic PA Form [phone] NCPDP) Created 3 days ago Sent to Plan 3 minutes ago Plan Response 3 minutes ago Submit Clinical Questions less than a minute ago Determination Wait for Determination Please wait for [employer] 2017 to return a determination.

## 2022-08-19 NOTE — Telephone Encounter (Signed)
Pt aware PA approved.

## 2022-08-19 NOTE — Telephone Encounter (Signed)
Pharmacy Patient Advocate Encounter  Received notification from [employer] that Prior Authorization for [facility] 0.25MG /0.5ML auto-injectors has been APPROVED from 07/20/22 to 03/17/23   PA #/Case ID/Reference #: [id number]

## 2022-08-24 ENCOUNTER — Encounter: Payer: Self-pay | Admitting: Family Medicine

## 2022-08-25 ENCOUNTER — Other Ambulatory Visit (HOSPITAL_COMMUNITY): Payer: Self-pay

## 2022-09-15 ENCOUNTER — Encounter: Payer: Self-pay | Admitting: Family Medicine

## 2022-09-15 ENCOUNTER — Other Ambulatory Visit: Payer: Self-pay | Admitting: Family Medicine

## 2022-10-18 ENCOUNTER — Encounter: Payer: Self-pay | Admitting: Family Medicine

## 2022-10-18 ENCOUNTER — Ambulatory Visit (INDEPENDENT_AMBULATORY_CARE_PROVIDER_SITE_OTHER): Payer: 59 | Admitting: Family Medicine

## 2022-10-18 DIAGNOSIS — D500 Iron deficiency anemia secondary to blood loss (chronic): Secondary | ICD-10-CM

## 2022-10-18 DIAGNOSIS — G43009 Migraine without aura, not intractable, without status migrainosus: Secondary | ICD-10-CM | POA: Diagnosis not present

## 2022-10-18 MED ORDER — RIZATRIPTAN BENZOATE 10 MG PO TABS
10.0000 mg | ORAL_TABLET | ORAL | 3 refills | Status: AC | PRN
Start: 2022-10-18 — End: ?

## 2022-10-18 MED ORDER — UBRELVY 100 MG PO TABS
1.0000 | ORAL_TABLET | ORAL | Status: DC | PRN
Start: 2022-10-18 — End: 2022-12-20

## 2022-10-18 MED ORDER — KETOROLAC TROMETHAMINE 60 MG/2ML IM SOLN
60.0000 mg | Freq: Once | INTRAMUSCULAR | Status: AC
Start: 2022-10-18 — End: 2022-10-18
  Administered 2022-10-18: 60 mg via INTRAMUSCULAR

## 2022-10-18 NOTE — Progress Notes (Signed)
Established Patient Office Visit  Subjective   Patient ID: Chelsea Snyder, female    DOB: 1980-09-17  Age: 42 y.o. MRN: [id number]  Chief Complaint  Patient presents with   Obesity    HPI Chelsea Snyder is here for a weight management follow up. She reports doing well on Weovy 0.5 mg now. She has some mild indigestion for a day or 2 after her injection but denies other side effects. She has done pretty well with her diet. She is has done minimal exercise but plans to work on it.   She has been taking a daily iron supplement. Misses a day here and there. Has increased iron in her diet.   Migraine for 4-5 days now. Intermittent nausea, photo and phonosensitivity. Denies vomiting. HA is on left side. She has taken rizatriptan daily without relief. Typically rizatriptan work well for her. She has failed imitrex in the past due to side effects.      ROS As per HPI.    Objective:     BP 108/67   Pulse 62   Temp 98.2 F (36.8 C) (Temporal)   Ht 5\' 5"  (1.651 m)   Wt 228 lb 4 oz (103.5 kg)   SpO2 99%   BMI 37.98 kg/m  Wt Readings from Last 3 Encounters:  10/18/22 228 lb 4 oz (103.5 kg)  08/16/22 237 lb 4 oz (107.6 kg)  07/20/22 235 lb 3.2 oz (106.7 kg)      Physical Exam Vitals and nursing note reviewed.  Constitutional:      General: She is not in acute distress.    Appearance: She is obese. She is not ill-appearing, toxic-appearing or diaphoretic.  HENT:     Right Ear: Tympanic membrane, ear canal and external ear normal.     Left Ear: Tympanic membrane, ear canal and external ear normal.     Nose: Nose normal.     Mouth/Throat:     Mouth: Mucous membranes are moist.     Pharynx: Oropharynx is clear.  Eyes:     Extraocular Movements: Extraocular movements intact.     Pupils: Pupils are equal, round, and reactive to light.  Cardiovascular:     Rate and Rhythm: Normal rate and regular rhythm.     Heart sounds: Normal heart sounds. No murmur heard. Pulmonary:     Effort:  Pulmonary effort is normal.     Breath sounds: Normal breath sounds.  Musculoskeletal:     Right lower leg: No edema.     Left lower leg: No edema.  Skin:    General: Skin is warm and dry.  Neurological:     General: No focal deficit present.     Mental Status: She is alert and oriented to person, place, and time.  Psychiatric:        Mood and Affect: Mood normal.      No results found for any visits on 10/18/22.    The 10-year ASCVD risk score (Arnett DK, et al., 2019) is: 0.8%    Assessment & Plan:   Chelsea Snyder was seen today for obesity.  Diagnoses and all orders for this visit:  Morbid obesity (HCC) Patient's BMI is >30 mg/m2.  Patient's current BMI is Body mass index is 37.98 kg/m.Marland Kitchen  Patient has lost and maintained a 9 lbs of body weight loss.  Patient is currently enrolled in a healthy eating plan along with encouraged exercise.   Patient does not have a personal or family history of medullary thyroid  carcinoma (MTC) or Multiple Endocrine Neoplasia syndrome type 2 (MEN 2).   Iron deficiency anemia due to chronic blood loss On iron supplement. Labs pending. Recent hysterectomy for AUB.  -     Anemia Profile B -     BMP8+EGFR; Future  Migraine without aura and without status migrainosus, not intractable Toradol injection today in office- do not take other NSAIDs with this. Sample given on ubrelvy to try today. Refilled rizatriptan to have on hand. She will let me know if Bernita Raisin works better for her and I will sent this in as an Rx as she has also failed sumatriptan in the past.  -     rizatriptan (MAXALT) 10 MG tablet; Take 1 tablet (10 mg total) by mouth as needed for migraine. May repeat in 2 hours if needed -     Ubrogepant (UBRELVY) 100 MG TABS; Take 1 tablet (100 mg total) by mouth as needed. -     ketorolac (TORADOL) injection 60 mg   Return in about 8 weeks (around 12/13/2022).   The patient indicates understanding of these issues and agrees with the  plan.  Gabriel Earing, FNP

## 2022-10-19 ENCOUNTER — Encounter: Payer: Self-pay | Admitting: Family Medicine

## 2022-10-19 LAB — ANEMIA PROFILE B
Basophils Absolute: 0 10*3/uL (ref 0.0–0.2)
Basos: 0 %
EOS (ABSOLUTE): 0.2 10*3/uL (ref 0.0–0.4)
Eos: 3 %
Ferritin: 24 ng/mL (ref 15–150)
Folate: 17.8 ng/mL (ref >3.0)
Hematocrit: 42.1 % (ref 34.0–46.6)
Hemoglobin: 13.4 g/dL (ref 11.1–15.9)
Immature Grans (Abs): 0 10*3/uL (ref 0.0–0.1)
Immature Granulocytes: 0 %
Iron Saturation: 19 % (ref 15–55)
Iron: 74 ug/dL (ref 27–159)
Lymphocytes Absolute: 1.9 10*3/uL (ref 0.7–3.1)
Lymphs: 27 %
MCH: 25.8 pg — ABNORMAL LOW (ref 26.6–33.0)
MCHC: 31.8 g/dL (ref 31.5–35.7)
MCV: 81 fL (ref 79–97)
Monocytes Absolute: 0.5 10*3/uL (ref 0.1–0.9)
Monocytes: 7 %
Neutrophils Absolute: 4.5 10*3/uL (ref 1.4–7.0)
Neutrophils: 63 %
Platelets: 251 10*3/uL (ref 150–450)
RBC: 5.2 x10E6/uL (ref 3.77–5.28)
RDW: 16.7 % — ABNORMAL HIGH (ref 11.7–15.4)
Retic Ct Pct: 1.2 % (ref 0.6–2.6)
Total Iron Binding Capacity: 385 ug/dL (ref 250–450)
UIBC: 311 ug/dL (ref 131–425)
Vitamin B-12: 502 pg/mL (ref 232–1245)
WBC: 7.1 10*3/uL (ref 3.4–10.8)

## 2022-12-20 ENCOUNTER — Ambulatory Visit (INDEPENDENT_AMBULATORY_CARE_PROVIDER_SITE_OTHER): Payer: 59 | Admitting: Family Medicine

## 2022-12-20 DIAGNOSIS — D500 Iron deficiency anemia secondary to blood loss (chronic): Secondary | ICD-10-CM

## 2022-12-20 DIAGNOSIS — F5104 Psychophysiologic insomnia: Secondary | ICD-10-CM | POA: Diagnosis not present

## 2022-12-20 DIAGNOSIS — E7841 Elevated Lipoprotein(a): Secondary | ICD-10-CM

## 2022-12-20 NOTE — Progress Notes (Signed)
Established Patient Office Visit  Subjective   Patient ID: Chelsea Snyder, female    DOB: [birth year]  Age: 42 y.o. MRN: [id number]  Chief Complaint  Patient presents with   Obesity    HPI Chelsea Snyder is here for her weight management follow up. She had her first 2.4 mg dose injection today. She has occasional GI upset, indigestion but not regularly. She has been eating a well balanced diet. She has been exercising at least 3x a week. She has lost a total of 17 lbs so far. Starting weight 237.  She continues on an iron supplement a few times a week. Denies dizziness, lightheadedness, palpitations, shortness of breath, fatigue She does have cold intolerance.   She has stopped taking trazodone due to grogginess the next day. She is getting 5-7 hours on average. She falls asleep pretty easily but has difficulty maintaining sleep. She has tried tylenol pm and melatonin without relief.      ROS As per HPI.    Objective:     BP 116/74   Pulse 77   Temp 98 F (36.7 C) (Temporal)   Ht 5\' 5"  (1.651 m)   Wt 220 lb (99.8 kg)   SpO2 98%   BMI 36.61 kg/m  Wt Readings from Last 3 Encounters:  12/20/22 220 lb (99.8 kg)  10/18/22 228 lb 4 oz (103.5 kg)  08/16/22 237 lb 4 oz (107.6 kg)      Physical Exam Vitals and nursing note reviewed.  Constitutional:      General: She is not in acute distress.    Appearance: Normal appearance. She is not ill-appearing.  Cardiovascular:     Rate and Rhythm: Normal rate and regular rhythm.     Pulses: Normal pulses.     Heart sounds: Normal heart sounds. No murmur heard. Pulmonary:     Effort: Pulmonary effort is normal. No respiratory distress.     Breath sounds: Normal breath sounds.  Musculoskeletal:     Right lower leg: No edema.     Left lower leg: No edema.  Skin:    General: Skin is warm and dry.  Neurological:     General: No focal deficit present.     Mental Status: She is alert and oriented to person, place, and time.   Psychiatric:        Mood and Affect: Mood normal.        Behavior: Behavior normal.    No results found for any visits on 12/20/22.    The 10-year ASCVD risk score (Arnett DK, et al., 2019) is: 0.9%    Assessment & Plan:   Chelsea Snyder was seen today for obesity.  Diagnoses and all orders for this visit:  Morbid obesity (HCC) Patient's BMI is >30 mg/m2.  Patient's current BMI is Body mass index is 36.61 kg/m.Marland Kitchen  Patient has lost and maintained a 5% body weight loss.  Patient is currently enrolled in a healthy eating plan along with encouraged exercise.   Patient does not have a personal or family history of medullary thyroid carcinoma (MTC) or Multiple Endocrine Neoplasia syndrome type 2 (MEN 2).  Elevated lipoprotein(a) Diet, exercise, weight loss.   Iron deficiency anemia due to chronic blood loss S/p hysterectomy. Continue iron supplement.   Chronic insomnia Discussed sleep hygiene, OTC medications. Discussed doxepin if no improvement with these measures.   Return in about 3 months (around 03/20/2023) for chronic follow up.   The patient indicates understanding of these issues and agrees with  the plan.  Gabriel Earing, FNP

## 2023-01-27 ENCOUNTER — Ambulatory Visit: Payer: Self-pay | Admitting: Family Medicine

## 2023-01-27 NOTE — Telephone Encounter (Signed)
Copied from CRM [id number]. Topic: Clinical - Red Word Triage >> Jan 27, 2023  1:40 PM Alvino Blood C wrote: Red Word that prompted transfer to Nurse Triage: Patient hurt her right ankle and is now experiencing some swelling an pain.  Chief Complaint: 5/10 right leg pain Symptoms:  Redness, bruised to ankle, some warmness to back of leg Frequency: last Thursday rolled ankle and it popped Pertinent Negatives: Patient denies open areas to leg Disposition: [] ED /[] Urgent Care (no appt availability in office) / [x] Appointment(In office/virtual)/ []  [place]/ [] Home Care/ [] Refused Recommended Disposition /[] [place] [facility]/ []  Follow-up with PCP Additional Notes: pt stated is taking ibuprofen at times to help with pain.  Reason for Disposition  [1] MODERATE pain (e.g., interferes with normal activities, limping) AND [2] present > 3 days  Answer Assessment - Initial Assessment Questions 1. ONSET: "When did the pain start?"      Last Thursday rolled ankle and it popped then swollen and painful 2. LOCATION: "Where is the pain located?"      Right leg below knee ankle and top of foot 3. PAIN: "How bad is the pain?"    (Scale 1-10; or mild, moderate, severe)   -  MILD (1-3): doesn't interfere with normal activities    -  MODERATE (4-7): interferes with normal activities (e.g., work or school) or awakens from sleep, limping    -  SEVERE (8-10): excruciating pain, unable to do any normal activities, unable to walk     5/10 pain and sometimes feels like foot goes to sleep when wearing shoe 4. WORK OR EXERCISE: "Has there been any recent work or exercise that involved this part of the body?"      N/a 5. CAUSE: "What do you think is causing the leg pain?"     injury 6. OTHER SYMPTOMS: "Do you have any other symptoms?" (e.g., chest pain, back pain, breathing difficulty, swelling, rash, fever, numbness, weakness)     Redness, bruised to ankle, some warmness to back of leg 7. PREGNANCY:  "Is there any chance you are pregnant?" "When was your last menstrual period?"     N/a  Protocols used: Leg Pain-A-AH

## 2023-01-28 ENCOUNTER — Ambulatory Visit (INDEPENDENT_AMBULATORY_CARE_PROVIDER_SITE_OTHER): Payer: 59

## 2023-01-28 ENCOUNTER — Encounter: Payer: Self-pay | Admitting: Family Medicine

## 2023-01-28 ENCOUNTER — Ambulatory Visit (INDEPENDENT_AMBULATORY_CARE_PROVIDER_SITE_OTHER): Payer: 59 | Admitting: Family Medicine

## 2023-01-28 VITALS — BP 120/73 | HR 79 | Temp 98.4°F | Ht 65.0 in | Wt 213.4 lb

## 2023-01-28 DIAGNOSIS — S99921A Unspecified injury of right foot, initial encounter: Secondary | ICD-10-CM

## 2023-01-28 DIAGNOSIS — M25471 Effusion, right ankle: Secondary | ICD-10-CM

## 2023-01-28 DIAGNOSIS — M25671 Stiffness of right ankle, not elsewhere classified: Secondary | ICD-10-CM

## 2023-01-28 DIAGNOSIS — S99911A Unspecified injury of right ankle, initial encounter: Secondary | ICD-10-CM

## 2023-01-28 DIAGNOSIS — S82839A Other fracture of upper and lower end of unspecified fibula, initial encounter for closed fracture: Secondary | ICD-10-CM

## 2023-01-28 MED ORDER — MELOXICAM 15 MG PO TABS: 15.0000 mg | ORAL_TABLET | Freq: Every day | ORAL | 1 refills | Status: DC

## 2023-01-28 NOTE — Progress Notes (Signed)
   Acute Office Visit  Subjective:     Patient ID: Chelsea Snyder, female    DOB: 01-23-1980, 43 y.o.   MRN: [id number]  Chief Complaint  Patient presents with   Ankle Pain    Ankle Pain  Incident onset: 8 days ago. Incident location: getting groceries out of her car. The injury mechanism was an eversion injury (heard multiple "pops" with injury). The pain is present in the right ankle, right leg, right heel and right foot. The quality of the pain is described as aching and shooting. The pain is at a severity of 5/10. The pain has been Intermittent since onset. Pertinent negatives include no inability to bear weight, numbness or tingling. Associated symptoms comments: Limited ROM. She reports no foreign bodies present. The symptoms are aggravated by palpation and movement (walking). She has tried ice, heat, elevation, NSAIDs and acetaminophen for the symptoms. The treatment provided no relief.    Review of Systems  Neurological:  Negative for tingling and numbness.        Objective:    BP 120/73   Pulse 79   Temp 98.4 F (36.9 C) (Temporal)   Ht 5\' 5"  (1.651 m)   Wt 213 lb 6.4 oz (96.8 kg)   SpO2 100%   BMI 35.51 kg/m    Physical Exam Vitals and nursing note reviewed.  Constitutional:      General: She is not in acute distress.    Appearance: She is not ill-appearing, toxic-appearing or diaphoretic.  Musculoskeletal:     Right ankle: Swelling present. No ecchymosis. Tenderness present over the lateral malleolus. Decreased range of motion.     Right Achilles Tendon: Tenderness present. No defects. Thompson's test negative.     Right foot: Normal range of motion and normal capillary refill. Swelling and tenderness present. No deformity.  Skin:    General: Skin is warm and dry.  Neurological:     General: No focal deficit present.     Mental Status: She is alert and oriented to person, place, and time.  Psychiatric:        Mood and Affect: Mood normal.         Behavior: Behavior normal.     No results found for any visits on 01/28/23.      Assessment & Plan:   Chelsea Snyder was seen today for ankle pain.  Diagnoses and all orders for this visit:  Injury of right ankle, initial encounter -     DG Ankle Complete Right; Future  Injury of right foot, initial encounter -     DG Foot Complete Right; Future  Decreased range of motion of right ankle  Swelling of right ankle joint -     meloxicam (MOBIC) 15 MG tablet; Take 1 tablet (15 mg total) by mouth daily.  Avulsion fracture of distal end of fibula -     meloxicam (MOBIC) 15 MG tablet; Take 1 tablet (15 mg total) by mouth daily. -     Ambulatory referral to Orthopedic Surgery   Avulsion fracture noted of distal tip of fibula, agree with radiology report. Placed in CAM boot. Mobic prn for pain/swelling. Elevate foot.Urgent referral to ortho discussed and placed.   The patient indicates understanding of these issues and agrees with the plan.  Gabriel Earing, FNP

## 2023-02-04 ENCOUNTER — Encounter: Payer: Self-pay | Admitting: Family Medicine

## 2023-03-21 ENCOUNTER — Encounter: Payer: Self-pay | Admitting: Family Medicine

## 2023-03-21 ENCOUNTER — Ambulatory Visit (INDEPENDENT_AMBULATORY_CARE_PROVIDER_SITE_OTHER): Payer: 59 | Admitting: Family Medicine

## 2023-03-21 DIAGNOSIS — F5104 Psychophysiologic insomnia: Secondary | ICD-10-CM | POA: Diagnosis not present

## 2023-03-21 DIAGNOSIS — D500 Iron deficiency anemia secondary to blood loss (chronic): Secondary | ICD-10-CM

## 2023-03-21 NOTE — Progress Notes (Signed)
   Established Patient Office Visit  Subjective   Patient ID: Chelsea Snyder, female    DOB: [birth year]  Age: 43 y.o. MRN: [id number]  Chief Complaint  Patient presents with   Obesity    HPI Chelsea Snyder is here for her weight management follow up. She has been complaint with wegovy 2.4 mg weekly. Denies side effects. She has maintained a well balanced diet. She has not been able to exercising consistently since her last appt due to an ankle fracture. She plans to start increasing physical activity again once this is healed.   Starting weight: 228 lb Total weight loss: 21 lb  Takes iron supplement sometimes. Denies dizziness, lightheadedness, palpitations, shortness of breath, fatigue.   Taking trazodone prn for sleep. Sleeping well most of the time.      ROS As per HPI.    Objective:     BP (!) 101/58   Pulse 72   Temp 97.8 F (36.6 C) (Temporal)   Ht 5\' 5"  (1.651 m)   Wt 207 lb 6.4 oz (94.1 kg)   SpO2 100%   BMI 34.51 kg/m  Wt Readings from Last 3 Encounters:  03/21/23 207 lb 6.4 oz (94.1 kg)  01/28/23 213 lb 6.4 oz (96.8 kg)  12/20/22 220 lb (99.8 kg)      Physical Exam Vitals and nursing note reviewed.  Constitutional:      General: She is not in acute distress.    Appearance: Normal appearance. She is not ill-appearing.  Cardiovascular:     Rate and Rhythm: Normal rate and regular rhythm.     Pulses: Normal pulses.     Heart sounds: Normal heart sounds. No murmur heard. Pulmonary:     Effort: Pulmonary effort is normal. No respiratory distress.     Breath sounds: Normal breath sounds.  Musculoskeletal:     Right lower leg: No edema.     Left lower leg: No edema.  Skin:    General: Skin is warm and dry.  Neurological:     General: No focal deficit present.     Mental Status: She is alert and oriented to person, place, and time.  Psychiatric:        Mood and Affect: Mood normal.        Behavior: Behavior normal.    No results found for any visits on  03/21/23.    The 10-year ASCVD risk score (Arnett DK, et al., 2019) is: 0.7%    Assessment & Plan:   Chelsea Snyder was seen today for obesity.  Diagnoses and all orders for this visit:  Morbid obesity (HCC) She has lost a total of 21 lbs since starting wegovy. Continue with well balanced diet. Restart physical activity when ankle has healed.   Iron deficiency anemia due to chronic blood loss Denies symtpoms.   Chronic insomnia Well controlled with lifestyle management and trazodone prn.    Return in about 3 months (around 06/21/2023) for chronic follow up.   The patient indicates understanding of these issues and agrees with the plan.  Gabriel Earing, FNP

## 2023-04-07 ENCOUNTER — Other Ambulatory Visit (HOSPITAL_COMMUNITY): Payer: Self-pay

## 2023-04-07 ENCOUNTER — Telehealth: Payer: Self-pay

## 2023-04-07 NOTE — Telephone Encounter (Signed)
 Pharmacy Patient Advocate Encounter  Received notification from [facility] [employer] that Prior Authorization for [email or web address] has been APPROVED from 03/08/2023 to 04/06/2024. Ran test claim, Copay is $0.00. This test claim was processed through [facility]- copay amounts may vary at other pharmacies due to pharmacy/plan contracts, or as the patient moves through the different stages of their insurance plan.  MAX QTY SPLY OF 0000003 PER 0021 DAY PRD MAXIMUM DAY SUPPLY OF 090 ALLOWED EXPIRATION [phone]  PA #/Case ID/Reference #: [id number]; KEY [id number]

## 2023-04-07 NOTE — Telephone Encounter (Signed)
 Pharmacy Patient Advocate Encounter   Received notification from CoverMyMeds that prior authorization for Wegovy 2.4MG /0.75ML auto-injectors is required/requested.   Insurance verification completed.   The patient is insured through [employer] .   Per test claim: PA required; PA started via CoverMyMeds. KEY [id number] . Waiting for clinical questions to populate.

## 2023-06-22 ENCOUNTER — Ambulatory Visit (INDEPENDENT_AMBULATORY_CARE_PROVIDER_SITE_OTHER): Admitting: Family Medicine

## 2023-06-22 ENCOUNTER — Encounter: Payer: Self-pay | Admitting: Family Medicine

## 2023-06-22 DIAGNOSIS — F5104 Psychophysiologic insomnia: Secondary | ICD-10-CM

## 2023-06-22 DIAGNOSIS — D500 Iron deficiency anemia secondary to blood loss (chronic): Secondary | ICD-10-CM

## 2023-06-22 MED ORDER — WEGOVY 2.4 MG/0.75ML ~~LOC~~ SOAJ: 2.4000 mg | SUBCUTANEOUS | 3 refills | Status: DC

## 2023-06-22 NOTE — Progress Notes (Signed)
   Established Patient Office Visit  Subjective   Patient ID: Chelsea Snyder, female    DOB: 1980-10-31  Age: 43 y.o. MRN: [id number]  Chief Complaint  Patient presents with   Medical Management of Chronic Issues    HPI Chelsea Snyder is here for her weight management follow up. She has been complaint with wegovy  2.4 mg weekly. Denies side effects. She has maintained a well balanced diet. Exercising 3x a week- mostly walking or hiking, sometimes aerobics or light weights.   Starting weight: 228 lb Total weight loss: 34 lb  Not taking iron  supplement. Feeling tired. Denies dizziness, lightheadedness, palpitations, shortness of breath, fatigue.   Sleeping well most of the time without trazodone  lately.    ROS As per HPI.    Objective:     BP 113/71   Pulse 73   Temp 98.2 F (36.8 C) (Temporal)   Ht 5' 5 (1.651 m)   Wt 193 lb 12.8 oz (87.9 kg)   SpO2 97%   BMI 32.25 kg/m  Wt Readings from Last 3 Encounters:  06/22/23 193 lb 12.8 oz (87.9 kg)  03/21/23 207 lb 6.4 oz (94.1 kg)  01/28/23 213 lb 6.4 oz (96.8 kg)      Physical Exam Vitals and nursing note reviewed.  Constitutional:      General: She is not in acute distress.    Appearance: Normal appearance. She is not ill-appearing.  Cardiovascular:     Rate and Rhythm: Normal rate and regular rhythm.     Pulses: Normal pulses.     Heart sounds: Normal heart sounds. No murmur heard. Pulmonary:     Effort: Pulmonary effort is normal. No respiratory distress.     Breath sounds: Normal breath sounds.  Musculoskeletal:     Right lower leg: No edema.     Left lower leg: No edema.  Skin:    General: Skin is warm and dry.  Neurological:     General: No focal deficit present.     Mental Status: She is alert and oriented to person, place, and time.  Psychiatric:        Mood and Affect: Mood normal.        Behavior: Behavior normal.    No results found for any visits on 06/22/23.    The 10-year ASCVD risk score (Arnett  DK, et al., 2019) is: 0.9%    Assessment & Plan:   Chelsea Snyder was seen today for medical management of chronic issues.  Diagnoses and all orders for this visit:  Morbid obesity (HCC) Continue wegovy , diet, and exercise. She has lost a total of 34 lbs so far.  -     WEGOVY  2.4 MG/0.75ML SOAJ; Inject 2.4 mg into the skin once a week.  Iron  deficiency anemia due to chronic blood loss Not currently on supplement. Reporting fatigue.  -     CBC with Differential/Platelet -     Iron , TIBC and Ferritin Panel  Chronic insomnia Well controlled right now without medication.    Return in about 8 weeks (around 08/17/2023) for medication follow up.   The patient indicates understanding of these issues and agrees with the plan.  Albertha Huger, FNP

## 2023-06-23 LAB — CBC WITH DIFFERENTIAL/PLATELET
Basophils Absolute: 0 10*3/uL (ref 0.0–0.2)
Basos: 1 %
EOS (ABSOLUTE): 0.2 10*3/uL (ref 0.0–0.4)
Eos: 2 %
Hematocrit: 42 % (ref 34.0–46.6)
Hemoglobin: 13.6 g/dL (ref 11.1–15.9)
Immature Grans (Abs): 0 10*3/uL (ref 0.0–0.1)
Immature Granulocytes: 0 %
Lymphocytes Absolute: 2.4 10*3/uL (ref 0.7–3.1)
Lymphs: 28 %
MCH: 29.2 pg (ref 26.6–33.0)
MCHC: 32.4 g/dL (ref 31.5–35.7)
MCV: 90 fL (ref 79–97)
Monocytes Absolute: 0.5 10*3/uL (ref 0.1–0.9)
Monocytes: 6 %
Neutrophils Absolute: 5.4 10*3/uL (ref 1.4–7.0)
Neutrophils: 62 %
Platelets: 237 10*3/uL (ref 150–450)
RBC: 4.65 x10E6/uL (ref 3.77–5.28)
RDW: 12.4 % (ref 11.7–15.4)
WBC: 8.6 10*3/uL (ref 3.4–10.8)

## 2023-06-23 LAB — IRON,TIBC AND FERRITIN PANEL
Ferritin: 64 ng/mL (ref 15–150)
Iron Saturation: 15 % (ref 15–55)
Iron: 51 ug/dL (ref 27–159)
Total Iron Binding Capacity: 348 ug/dL (ref 250–450)
UIBC: 297 ug/dL (ref 131–425)

## 2023-06-24 ENCOUNTER — Ambulatory Visit: Payer: Self-pay | Admitting: Family Medicine

## 2023-08-18 ENCOUNTER — Ambulatory Visit: Admitting: Family Medicine

## 2023-08-22 ENCOUNTER — Other Ambulatory Visit: Payer: Self-pay | Admitting: *Deleted

## 2023-08-31 ENCOUNTER — Encounter: Payer: Self-pay | Admitting: Family Medicine

## 2023-08-31 ENCOUNTER — Ambulatory Visit (INDEPENDENT_AMBULATORY_CARE_PROVIDER_SITE_OTHER): Admitting: Family Medicine

## 2023-08-31 DIAGNOSIS — R10814 Left lower quadrant abdominal tenderness: Secondary | ICD-10-CM | POA: Diagnosis not present

## 2023-08-31 DIAGNOSIS — F5104 Psychophysiologic insomnia: Secondary | ICD-10-CM

## 2023-08-31 DIAGNOSIS — D2230 Melanocytic nevi of unspecified part of face: Secondary | ICD-10-CM

## 2023-08-31 DIAGNOSIS — R1032 Left lower quadrant pain: Secondary | ICD-10-CM | POA: Diagnosis not present

## 2023-08-31 MED ORDER — AMOXICILLIN-POT CLAVULANATE 875-125 MG PO TABS: 1.0000 | ORAL_TABLET | Freq: Two times a day (BID) | ORAL | 0 refills | Status: AC

## 2023-08-31 NOTE — Progress Notes (Signed)
 Established Patient Office Visit  Subjective   Patient ID: Chelsea Snyder, female    DOB: [birth year]  Age: 43 y.o. MRN: [id number]  Chief Complaint  Patient presents with   Obesity    HPI Chelsea Snyder is here for her weight management follow up. She has been complaint with wegovy  2.4 mg weekly.  She has lost another 4 lbs since her last visit. She has maintained a well balanced diet. Exercising 3x a week- mostly walking on a walking pad.   She has had some LLQ abdominal cramping. This has been waxing and waning for the last few weeks. Sometimes tender to the touch over the left side. Stools have been looser than usual. No fever, nausea, or vomiting. She does have IBS with alternating diarrhea, constipation but this has been difficulty that her typical. No urinary or vaginal symptoms. Hx of hysterectomy.   Starting weight: 228 lb Total weight loss: 38 lb   Would like referral to dermatology for removal of mole on the right side of her chin. This has been present for many years but has gotten larger. Also reports tenderness intermittently with redness, swelling, and whitehead for weeks at a time.      08/31/2023    3:26 PM 03/21/2023    4:12 PM 01/28/2023    2:08 PM  Depression screen PHQ 2/9  Decreased Interest 0 0 0  Down, Depressed, Hopeless 0 0 0  PHQ - 2 Score 0 0 0  Altered sleeping 1 1 1   Tired, decreased energy 1 1 1   Change in appetite 0 0 0  Feeling bad or failure about yourself  0 0 0  Trouble concentrating 0 0 0  Moving slowly or fidgety/restless 0 0 0  Suicidal thoughts 0 0 0  PHQ-9 Score 2 2 2   Difficult doing work/chores Not difficult at all Not difficult at all Not difficult at all      08/31/2023    3:27 PM 03/21/2023    4:13 PM 01/28/2023    2:08 PM 12/20/2022    4:05 PM  GAD 7 : Generalized Anxiety Score  Nervous, Anxious, on Edge 0 0 0 0  Control/stop worrying 0 0 0 0  Worry too much - different things 0 0 0 0  Trouble relaxing 0 0 0 0  Restless 0 0 0 0   Easily annoyed or irritable 0 0 0 0  Afraid - awful might happen 0 0 0 0  Total GAD 7 Score 0 0 0 0  Anxiety Difficulty Not difficult at all Not difficult at all Not difficult at all Not difficult at all     ROS As per HPI.    Objective:     BP 111/69   Pulse 69   Temp 98 F (36.7 C) (Temporal)   Ht 5' 5 (1.651 m)   Wt 190 lb 12.8 oz (86.5 kg)   SpO2 98%   BMI 31.75 kg/m  Wt Readings from Last 3 Encounters:  08/31/23 190 lb 12.8 oz (86.5 kg)  06/22/23 193 lb 12.8 oz (87.9 kg)  03/21/23 207 lb 6.4 oz (94.1 kg)      Physical Exam Vitals and nursing note reviewed.  Constitutional:      General: She is not in acute distress.    Appearance: Normal appearance. She is not ill-appearing.  HENT:     Mouth/Throat:     Mouth: Mucous membranes are moist.  Eyes:     General: No scleral icterus. Cardiovascular:  Rate and Rhythm: Normal rate and regular rhythm.     Pulses: Normal pulses.     Heart sounds: Normal heart sounds. No murmur heard. Pulmonary:     Effort: Pulmonary effort is normal. No respiratory distress.     Breath sounds: Normal breath sounds.  Abdominal:     General: Bowel sounds are normal. There is no distension.     Tenderness: There is abdominal tenderness in the left lower quadrant. There is no guarding or rebound. Negative signs include Murphy's sign and McBurney's sign.  Musculoskeletal:     Right lower leg: No edema.     Left lower leg: No edema.  Skin:    General: Skin is warm and dry.     Coloration: Skin is not jaundiced.     Findings: Lesion (1 cm fleshed colored raised papule to right side of chin) present.  Neurological:     General: No focal deficit present.     Mental Status: She is alert and oriented to person, place, and time.  Psychiatric:        Mood and Affect: Mood normal.        Behavior: Behavior normal.    No results found for any visits on 08/31/23.    The 10-year ASCVD risk score (Arnett DK, et al., 2019) is:  0.9%    Assessment & Plan:   Chelsea Snyder was seen today for obesity.  Diagnoses and all orders for this visit:  Morbid obesity (HCC) Doing well with wegovy , diet, and exercise. She has lost 38 lbs so far.   Chronic insomnia Continue trazodone  prn.   Left lower quadrant abdominal tenderness without rebound tenderness LLQ pain With increase frequency of loose stools. Discussed diverticulitis vs wegovy  side effects vs IBS. Declined CT today. Will treat empirically with Augmentin  as below for diverticulitis. Liquid diet discussed. Strict return precautions given.  -     amoxicillin -clavulanate (AUGMENTIN ) 875-125 MG tablet; Take 1 tablet by mouth 2 (two) times daily for 10 days.  Change in facial mole Referral discussed and placed.  -     Ambulatory referral to Dermatology   Return in about 6 weeks (around 10/12/2023) for medication follow up.   The patient indicates understanding of these issues and agrees with the plan.  Chelsea CHRISTELLA Search, FNP

## 2023-10-13 ENCOUNTER — Encounter: Payer: Self-pay | Admitting: Family Medicine

## 2023-10-13 ENCOUNTER — Ambulatory Visit (INDEPENDENT_AMBULATORY_CARE_PROVIDER_SITE_OTHER): Admitting: Family Medicine

## 2023-10-13 NOTE — Progress Notes (Signed)
 Established Patient Office Visit  Subjective   Patient ID: Chelsea Snyder, female    DOB: 1980-02-10  Age: 43 y.o. MRN: [id number]  Chief Complaint  Patient presents with   Medical Management of Chronic Issues    HPI  History of Present Illness   Chelsea Snyder is a 43 year old female who presents for follow-up on Wegovy  and weight management.  Obesity and weight management - Currently on Wegovy  2.4 mg for weight management - Total weight loss of 45 pounds from a starting weight of 237 pounds; current weight is 192 pounds - Gained 2 pounds since last visit - Improved fit of clothing and increased physical activity - Diet is generally healthy with occasional cravings for candy - Focusing on portion control, increased home cooking, and reduced overall intake  Physical activity - Engages in hiking with her daughter, covering 2.5 to 3.5 miles per day, five days per week, ongoing for approximately one month  Medication administration issues - Experienced a misfire with one Wegovy  injection, resulting in a missed week of medication - Unable to obtain an early refill due to insurance restrictions - Did not contact customer support for a replacement dose  Abdominal pain - Completed a course of antibiotics prescribed at the last visit - Abdominal pain has improved; no pain at rest - Occasional abdominal pain associated with episodes of upset stomach         ROS As per HPI.    Objective:     BP 99/66   Pulse 67   Temp 98.1 F (36.7 C) (Temporal)   Ht 5' 5 (1.651 m)   Wt 192 lb 3.2 oz (87.2 kg)   SpO2 98%   BMI 31.98 kg/m  Wt Readings from Last 3 Encounters:  10/13/23 192 lb 3.2 oz (87.2 kg)  08/31/23 190 lb 12.8 oz (86.5 kg)  06/22/23 193 lb 12.8 oz (87.9 kg)      Physical Exam Vitals and nursing note reviewed.  Constitutional:      General: She is not in acute distress.    Appearance: Normal appearance. She is not ill-appearing.  Cardiovascular:      Rate and Rhythm: Normal rate and regular rhythm.     Pulses: Normal pulses.     Heart sounds: Normal heart sounds. No murmur heard. Pulmonary:     Effort: Pulmonary effort is normal. No respiratory distress.     Breath sounds: Normal breath sounds.  Musculoskeletal:     Right lower leg: No edema.     Left lower leg: No edema.  Skin:    General: Skin is warm and dry.  Neurological:     General: No focal deficit present.     Mental Status: She is alert and oriented to person, place, and time.  Psychiatric:        Mood and Affect: Mood normal.        Behavior: Behavior normal.      No results found for any visits on 10/13/23.    The 10-year ASCVD risk score (Arnett DK, et al., 2019) is: 0.7%    Assessment & Plan:   Chelsea Snyder was seen today for medical management of chronic issues.  Diagnoses and all orders for this visit:  Morbid obesity (HCC)      Morbid obesity due to excess calories Significant weight loss achieved with Wegovy . Recent minor weight gain likely non-significant. BMI reduced to 31. Discussed insurance coverage concerns and long-term safety of Wegovy . Emphasized health over  specific BMI targets. - Continue Wegovy  2.4 mg. - Encourage healthy diet and regular exercise. - Set incremental weight loss goals focusing on health.       Return in about 8 weeks (around 12/08/2023) for medication follow up.   The patient indicates understanding of these issues and agrees with the plan.  Chelsea CHRISTELLA Search, FNP

## 2023-11-07 ENCOUNTER — Encounter: Payer: Self-pay | Admitting: Physician Assistant

## 2023-11-07 ENCOUNTER — Ambulatory Visit (INDEPENDENT_AMBULATORY_CARE_PROVIDER_SITE_OTHER): Admitting: Physician Assistant

## 2023-11-07 VITALS — BP 129/87 | HR 70

## 2023-11-07 DIAGNOSIS — D225 Melanocytic nevi of trunk: Secondary | ICD-10-CM

## 2023-11-07 DIAGNOSIS — D2330 Other benign neoplasm of skin of unspecified part of face: Secondary | ICD-10-CM

## 2023-11-07 DIAGNOSIS — D2239 Melanocytic nevi of other parts of face: Secondary | ICD-10-CM

## 2023-11-07 DIAGNOSIS — D489 Neoplasm of uncertain behavior, unspecified: Secondary | ICD-10-CM | POA: Diagnosis not present

## 2023-11-07 NOTE — Patient Instructions (Addendum)

## 2023-11-07 NOTE — Progress Notes (Signed)
   New Patient Visit   Subjective  Chelsea Snyder is a 43 y.o. female NEW PATIENT who presents for the following: Spot check  Patient states she has two moles located at the face/chin and chest that she would like to have examined. Patient reports the areas have been there for all her life . She reports the areas are bothersome.Patient rates irritation 4 out of 10 when the place on her chest swelled up and turned red. She states that the areas have not spread. Patient reports she has not previously been treated for these areas. Patient admits Hx of bx of her back that came back atypical and have been removed. Patient denies family history of skin cancer(s).    The following portions of the chart were reviewed this encounter and updated as appropriate: medications, allergies, medical history  Review of Systems:  No other skin or systemic complaints except as noted in HPI or Assessment and Plan.  Objective  Well appearing patient in no apparent distress; mood and affect are within normal limits.  A focused examination was performed of the following areas: Chest and face  Relevant exam findings are noted in the Assessment and Plan.          Right submandibular 0.6 Flesh colored papule with erythema  Right chest 0.5 Flesh colored papule with erythema   Assessment & Plan   INTRADERMAL NEVUS OF FACE - CHIN  - reassurance this is benign     NEOPLASM OF UNCERTAIN BEHAVIOR (2) Right submandibular Epidermal / dermal shaving  Lesion diameter (cm):  0.6 Informed consent: discussed and consent obtained   Timeout: patient name, date of birth, surgical site, and procedure verified   Procedure prep:  Patient was prepped and draped in usual sterile fashion Prep type:  Isopropyl alcohol Anesthesia: the lesion was anesthetized in a standard fashion   Anesthetic:  1% lidocaine  w/ epinephrine 1-100,000 buffered w/ 8.4% NaHCO3 Instrument used: flexible razor blade   Hemostasis achieved  with: pressure, aluminum chloride and electrodesiccation   Outcome: patient tolerated procedure well   Post-procedure details: sterile dressing applied and wound care instructions given   Dressing type: bandage and petrolatum    Specimen 1 - Surgical pathology Differential Diagnosis: DN vs inflamed nevus  Check Margins: No Right chest Epidermal / dermal shaving  Lesion diameter (cm):  0.5 Informed consent: discussed and consent obtained   Timeout: patient name, date of birth, surgical site, and procedure verified   Procedure prep:  Patient was prepped and draped in usual sterile fashion Prep type:  Isopropyl alcohol Anesthesia: the lesion was anesthetized in a standard fashion   Anesthetic:  1% lidocaine  w/ epinephrine 1-100,000 buffered w/ 8.4% NaHCO3 Instrument used: flexible razor blade   Hemostasis achieved with: pressure, aluminum chloride and electrodesiccation   Outcome: patient tolerated procedure well   Post-procedure details: sterile dressing applied and wound care instructions given   Dressing type: bandage and petrolatum    Specimen 2 - Surgical pathology Differential Diagnosis: DN vs inflamed nevus  Check Margins: No INTRADERMAL NEVUS OF FACE    Return for pending pathology.  I, Doyce Pan, CMA, am acting as scribe for Falisa Lamora K, PA-C.   Documentation: I have reviewed the above documentation for accuracy and completeness, and I agree with the above.  Cap Massi K, PA-C

## 2023-11-08 ENCOUNTER — Encounter: Payer: Self-pay | Admitting: Physician Assistant

## 2023-11-09 LAB — SURGICAL PATHOLOGY

## 2023-11-14 ENCOUNTER — Ambulatory Visit: Payer: Self-pay | Admitting: Physician Assistant

## 2024-02-08 ENCOUNTER — Ambulatory Visit: Admitting: Family Medicine

## 2024-02-08 ENCOUNTER — Telehealth: Payer: Self-pay | Admitting: Family Medicine

## 2024-02-08 NOTE — Telephone Encounter (Signed)
 Advised pt a years worth was sent over in June so she should be able to get a refill from the pharmacy and pt voiced understanding.

## 2024-02-08 NOTE — Telephone Encounter (Signed)
 Copied from CRM [id number]. Topic: Clinical - Prescription Issue >> Feb 08, 2024  3:21 PM Ivette P wrote: Reason for CRM: Pt called in due to appt cancelled by provider.   Pt was expected to get wegovy . Can she get a hold over until next appt on 02/13 @ 4 pm?   Please follow up with pt

## 2024-02-14 ENCOUNTER — Other Ambulatory Visit: Payer: Self-pay | Admitting: Family Medicine

## 2024-02-24 ENCOUNTER — Ambulatory Visit: Admitting: Family Medicine

## 2024-05-21 ENCOUNTER — Ambulatory Visit: Admitting: Physician Assistant

## 2025-11-06 ENCOUNTER — Ambulatory Visit: Admitting: Physician Assistant
